# Patient Record
Sex: Female | Born: 1955 | Race: White | Hispanic: No | Marital: Married | Smoking: Current every day smoker
Health system: Southern US, Community
[De-identification: ages and names within clinical notes are randomized; demographics above are authoritative.]

## PROBLEM LIST (undated history)

## (undated) DIAGNOSIS — J449 Chronic obstructive pulmonary disease, unspecified: Secondary | ICD-10-CM

## (undated) DIAGNOSIS — I10 Essential (primary) hypertension: Secondary | ICD-10-CM

## (undated) DIAGNOSIS — J45909 Unspecified asthma, uncomplicated: Secondary | ICD-10-CM

## (undated) DIAGNOSIS — M1990 Unspecified osteoarthritis, unspecified site: Secondary | ICD-10-CM

---

## 2004-10-20 ENCOUNTER — Ambulatory Visit: Payer: Self-pay | Admitting: Internal Medicine

## 2006-07-06 ENCOUNTER — Ambulatory Visit: Payer: Self-pay | Admitting: Unknown Physician Specialty

## 2009-01-14 ENCOUNTER — Ambulatory Visit: Payer: Self-pay

## 2010-01-15 ENCOUNTER — Ambulatory Visit: Payer: Self-pay

## 2011-01-06 ENCOUNTER — Ambulatory Visit: Payer: Self-pay | Admitting: Family Medicine

## 2011-01-15 ENCOUNTER — Ambulatory Visit: Payer: Self-pay | Admitting: Family Medicine

## 2011-01-27 ENCOUNTER — Ambulatory Visit: Payer: Self-pay | Admitting: Family Medicine

## 2012-10-08 ENCOUNTER — Ambulatory Visit: Payer: Self-pay | Admitting: Internal Medicine

## 2013-01-12 ENCOUNTER — Ambulatory Visit: Payer: Self-pay | Admitting: Obstetrics and Gynecology

## 2014-03-20 ENCOUNTER — Ambulatory Visit: Payer: Self-pay | Admitting: Obstetrics and Gynecology

## 2014-03-28 ENCOUNTER — Ambulatory Visit: Payer: Self-pay | Admitting: Emergency Medicine

## 2014-05-24 ENCOUNTER — Ambulatory Visit: Payer: Self-pay | Admitting: Gastroenterology

## 2014-05-27 LAB — PATHOLOGY REPORT

## 2014-10-25 ENCOUNTER — Observation Stay: Payer: Self-pay | Admitting: Internal Medicine

## 2014-10-25 LAB — CBC
HCT: 48.2 % — AB (ref 35.0–47.0)
HGB: 16.1 g/dL — ABNORMAL HIGH (ref 12.0–16.0)
MCH: 31.9 pg (ref 26.0–34.0)
MCHC: 33.4 g/dL (ref 32.0–36.0)
MCV: 95 fL (ref 80–100)
PLATELETS: 300 10*3/uL (ref 150–440)
RBC: 5.05 10*6/uL (ref 3.80–5.20)
RDW: 12.4 % (ref 11.5–14.5)
WBC: 5.5 10*3/uL (ref 3.6–11.0)

## 2014-10-25 LAB — BASIC METABOLIC PANEL
Anion Gap: 8 (ref 7–16)
BUN: 12 mg/dL (ref 7–18)
CHLORIDE: 109 mmol/L — AB (ref 98–107)
Calcium, Total: 8.8 mg/dL (ref 8.5–10.1)
Co2: 27 mmol/L (ref 21–32)
Creatinine: 0.66 mg/dL (ref 0.60–1.30)
Glucose: 103 mg/dL — ABNORMAL HIGH (ref 65–99)
Osmolality: 287 (ref 275–301)
Potassium: 3.7 mmol/L (ref 3.5–5.1)
SODIUM: 144 mmol/L (ref 136–145)

## 2014-10-25 LAB — TROPONIN I

## 2014-10-26 LAB — BASIC METABOLIC PANEL
Anion Gap: 4 — ABNORMAL LOW (ref 7–16)
BUN: 12 mg/dL (ref 7–18)
CHLORIDE: 110 mmol/L — AB (ref 98–107)
CO2: 29 mmol/L (ref 21–32)
Calcium, Total: 8.2 mg/dL — ABNORMAL LOW (ref 8.5–10.1)
Creatinine: 0.68 mg/dL (ref 0.60–1.30)
EGFR (African American): >60
EGFR (Non-African Amer.): >60
Glucose: 100 mg/dL — ABNORMAL HIGH (ref 65–99)
OSMOLALITY: 285 (ref 275–301)
Potassium: 4 mmol/L (ref 3.5–5.1)
Sodium: 143 mmol/L (ref 136–145)

## 2014-10-26 LAB — CBC WITH DIFFERENTIAL/PLATELET
BASOS PCT: 1 %
Basophil #: 0.1 10*3/uL (ref 0.0–0.1)
EOS ABS: 0.3 10*3/uL (ref 0.0–0.7)
Eosinophil %: 4.1 %
HCT: 43.5 % (ref 35.0–47.0)
HGB: 14.8 g/dL (ref 12.0–16.0)
Lymphocyte #: 2.9 10*3/uL (ref 1.0–3.6)
Lymphocyte %: 45.8 %
MCH: 32 pg (ref 26.0–34.0)
MCHC: 34 g/dL (ref 32.0–36.0)
MCV: 94 fL (ref 80–100)
Monocyte #: 0.5 x10 3/mm (ref 0.2–0.9)
Monocyte %: 7.7 %
Neutrophil #: 2.6 10*3/uL (ref 1.4–6.5)
Neutrophil %: 41.4 %
Platelet: 265 10*3/uL (ref 150–440)
RBC: 4.62 10*6/uL (ref 3.80–5.20)
RDW: 12.6 % (ref 11.5–14.5)
WBC: 6.3 10*3/uL (ref 3.6–11.0)

## 2014-10-26 LAB — LIPID PANEL
Cholesterol: 176 mg/dL (ref 0–200)
HDL Cholesterol: 63 mg/dL — ABNORMAL HIGH (ref 40–60)
Ldl Cholesterol, Calc: 105 mg/dL — ABNORMAL HIGH (ref 0–100)
Triglycerides: 39 mg/dL (ref 0–200)
VLDL Cholesterol, Calc: 8 mg/dL (ref 5–40)

## 2014-10-26 LAB — HEMOGLOBIN A1C: HEMOGLOBIN A1C: 5.7 % (ref 4.2–6.3)

## 2014-11-01 DIAGNOSIS — Z720 Tobacco use: Secondary | ICD-10-CM | POA: Insufficient documentation

## 2014-11-01 DIAGNOSIS — Z8673 Personal history of transient ischemic attack (TIA), and cerebral infarction without residual deficits: Secondary | ICD-10-CM | POA: Insufficient documentation

## 2014-11-19 DIAGNOSIS — I10 Essential (primary) hypertension: Secondary | ICD-10-CM | POA: Insufficient documentation

## 2014-12-20 DIAGNOSIS — G459 Transient cerebral ischemic attack, unspecified: Secondary | ICD-10-CM

## 2014-12-20 HISTORY — DX: Transient cerebral ischemic attack, unspecified: G45.9

## 2015-02-26 ENCOUNTER — Emergency Department: Payer: Self-pay | Admitting: Emergency Medicine

## 2015-04-12 NOTE — H&P (Signed)
PATIENT NAME:  Courtney [name], Courtney [name] MR#:  [id number] DATE OF BIRTH:  [birth year]  DATE OF ADMISSION:  10/25/2014  PRIMARY CARE PHYSICIAN:  None.    EMERGENCY ROOM PHYSICIAN: Jene Every, MD   CHIEF COMPLAINT: Left arm numbness.   HISTORY OF PRESENT ILLNESS:  This a 59 year old female patient who felt left arm numbness while driving to work today. The patient suddenly fellt  numbness in the left arm started her around 8:00 a.m. lasted for about 20 minutes.  After that, the numbness resolved, did not have any numbness in the legs.  No weakness in the left hand or left leg. No numbness or weakness in the right extremities. Denies any slurred speech. No facial droop, no blurred vision. No loss of consciousness. No headache. Concerning the numbness, the patient came to the Emergency Room. The patient's symptoms resolved by the time she came to the ER. In the ER, the patient had a blood pressure check, which is showing 195/93.  She had a CAT scan of the head which showed left frontal hypodensity suspicious for acute ischemia.  The patient is being admitted to observation status for stroke evaluation.  She denies any other complaints. Recently, recovering from, a common cold and bronchitis. The patient finished a course of Zithromax. Right now, she is using Flonase for fluid in her left ear.   PAST MEDICAL HISTORY: Significant for COPD, denies any high blood pressure and does not have any family doctor and has no follow-ups.   ALLERGIES: No known allergies.   SOCIAL HISTORY: Smokes about a pack per day, smoking for 40 years, no alcohol. No drugs. Works at [employer].  FAMILY HISTORY: Colon cancer in father.   PAST SURGICAL HISTORY: Significant for history of C-section.   HOME MEDICATIONS:  Albuterol 2 to 4 puffs every 4 hours as needed for wheezing and Flonase 2 sprays once a day.  REVIEW OF SYSTEMS.  CONSTITUTIONAL: No fever. No fatigue.  EYES: No blurred vision.  ENT: No tinnitus, has some ear  pain and fluid in the left ear and is getting treatment for that. She says the Flonase helped her. No difficulty swallowing.  RESPIRATORY: No cough. No wheezing.  CARDIOVASCULAR: No chest pain. No orthopnea. No pedal edema. No palpitations. No syncope.  GASTROINTESTINAL: No nausea. No vomiting. No abdominal pain. No hematemesis.  GENITOURINARY: No dysuria or hematuria.  ENDOCRINE: No polyuria or nocturia.  HEMATOLOGIC: No anemia or easy bruising.  INTEGUMENTARY: No skin rash.  MUSCULOSKELETAL: Denies any joint pains. No history of gout, no limited activity. NEUROLOGIC: Felt numb in the left hand this morning, resolved now.  There was no dementia, no epilepsy. No headache. No history of transient ischemic attacks. No seizures.  PSYCHIATRIC: No anxiety or depression.   PHYSICAL EXAMINATION:  VITAL SIGNS: Temperature 98 degrees Fahrenheit, heart rate 85, blood pressure 195/90, sats 100% on room air.  GENERAL: Alert, awake, and oriented 59 year old female seen in the Emergency Room, not in distress, answering questions appropriately.  HEAD: Atraumatic, normocephalic.  EYES: Pupils equal, reacting to light. No conjunctival pallor. No scleral icterus.  Nose:no Turbinate Hypertrophy, Throat:no Oropharyngeal erythema  EARS: No drainage. No external lesions. No tympanic membrane congestion in both ears.   NECK: Did not have any carotid bruits. Normal range of motion. No lymphadenopathy.  RESPIRATORY: Clear to auscultation. No wheeze. No rales. Not using accessory muscles for respiration.  CARDIOVASCULAR: S1, S2 regular. No murmurs. PMI not displaced. Pulses equal at carotid artery femoral and (dorsalis pedis.Marland Kitchen  No peripheral edema. GASTROINTESTINAL: Abdomen is soft, nontender, nondistended. Bowel sounds present. No organomegaly. No hernias.  EXTREMITIES: No extremity edema. No cyanosis, no clubbing.  SKIN: No skin rashes.  Warm and dry, good skin turgor.  NEUROLOGIC: Cranial nerves II through XII  are intact. Power 5/5 in upper and lower extremities. Sensation is intact. Bilaterally upper and lower extremities, deep tendon reflexes 2+ bilateral upper and lower extremities.   PSYCHIATRIC: Mood and affect are within normal limits.   LABORATORY DATA: WBC 5.3, hemoglobin 16.1, hematocrit 48.2, platelets 300,000.   Electrolytes; Sodium 144, potassium 3.7, chloride 109, bicarbonate 27, BUN 12, creatinine 0.6, glucose 103. LFTs within normal limits.  Troponin less than 0.02. Head CAT scan shows hypodensity in the left frontal lobe.  EKG shows normal sinus rhythm at 67 beats per minute.   ASSESSMENT AND PLAN:  161.  A 59 year old female with left arm numbness which resolved, but CT head is questionable for acute stroke in the left frontal lobe.  Admit her to observation status as left arm numbness does not cover the left frontal infarct.  Get an MRA of the brain to evaluate for any embolic phenomenon, monitor on telemetry for any arrhythmias. Check echocardiogram and also ultrasound of carotids.  Start her on aspirin and statins.  The patient will get neuro-checks every 2 hours for 4 times and then afterward every 4 hours for 24 hours. The patient has a risk factor of tobacco abuse and now has elevated blood pressure.  2.  Hypertension with no previous diagnosis. She may have essential hypertension, started on small dose beta blockers and keep the blood pressure a little bit high to allow permissive hypertension; keep blood pressure systolic 160/90.  3.  Tobacco abuse. counselled about smoking cessation  for 3 to 10 minutes. The patient will get nicotine patch.  4.  Check fasting lipids.  TIME SPENT: 55 minutes.       ____________________________ Katha HammingSnehalatha Alajah Witman, MD sk:DT D: 10/25/2014 14:17:43 ET T: 10/25/2014 15:03:21 ET JOB#: [id number]  cc: Katha HammingSnehalatha Neeka Urista, MD, <Dictator> Katha HammingSNEHALATHA Jaymie Mckiddy MD ELECTRONICALLY SIGNED 11/18/2014 18:40

## 2015-04-12 NOTE — Discharge Summary (Signed)
PATIENT NAME:  Courtney Courtney [name], Courtney Courtney [name] MR#:  [id number] DATE OF BIRTH:  May 26, 1956  DATE OF ADMISSION:  10/25/2014 DATE OF DISCHARGE:  10/26/2014  PRIMARY CARE PHYSICIAN: None.   CHIEF COMPLAINT: Left arm numbness.   ADMITTING DIAGNOSES:  1.  Left arm numbness with Courtney [name] CT head questionable for an acute cerebrovascular accident  in left frontal lobe.  2.  Hypertension with no previous diagnosis.  3.  Tobacco abuse   DISCHARGE DIAGNOSES:  1.  Transient ischemic attack, left arm numbness is completely resolved.  2.  Elevated blood pressure is completely resolved, blood pressure being normal not providing any medications.  3.  Tobacco abuse, counseled the patient to quit smoking.  4.  Hyperlipidemia.  5.  Lifestyle modification with diet and exercise.   PROCEDURES: None.   CONSULTATIONS: None.   BRIEF HISTORY AND PHYSICAL AND HOSPITAL COURSE: The patient is Courtney [name] 59 year old female who came in with left-sided numbness while driving to work on November 6. Please review history and physical for details. Her initial blood pressure was high at 195/93 in the ED.  CAT scan of the head shows left frontal hyperdensity suspicious for acute ischemia.   HOSPITAL COURSE: The patient was admitted to the hospital. MRI of the brain was done without contrast which has revealed no acute intracranial abnormality. Left frontal subcortical lesion was compatible with remote ischemia. Carotid Dopplers were done, no significant atherosclerosis was noticed. Echocardiogram has revealed normal ejection fraction of 60%-65%. The patient's left upper extremity weakness is completely resolved. She denies any dysphagia or dysarthria, ambulatory difficulties, or headache. No blurry vision either. Fasting lipid panel was checked. The patient's LDL was at 105. Condition being stable, decision is to discharge the patient home. We have recommended the patient to continue baby aspirin and lifestyle modifications were advised for LDL being at  105. She verbalized understanding.  The patient is hesitant to start any statins at this time and she prefers following with her primary care as an outpatient.   The patient's blood pressure was monitored closely, on November 6 it was at 108/68 and November 7 was 130/72, 124/80. Blood pressure being stable I was really hesitant to start her on any blood pressure medications. The patient reported that sometimes she gets stressed at work which could be the cause of her transient elevation of blood pressure. We have recommended the patient to continue monitoring her blood pressure on Courtney [name] daily basis and maintain blood pressure log. She verbalized understanding. Nicotine cessation counseling was provided and the patient was recommended to quit smoking and continue over-the-counter nicotine patch.   CONDITION AT THE TIME OF DISCHARGE: Stable.   ACTIVITY: As tolerated.   FOLLOWUP: Follow up with her primary care in 1-2 weeks.   MEDICATIONS AT THE TIME OF DISCHARGE: Aspirin 81 mg p.o. once daily, Lipitor 10 mg p.o. at bedtime was written, but the patient is uncomfortable taking this medication, she wants to try lifestyle modifications at this time. Nicotine counseling was provided. The patient is to continue nicotine 7 mg patch transdermally once daily, Flonase 2 sprays nasally once daily, albuterol 2-4 puffs inhalation every 4 hours as needed for shortness of breath.   DIET: Low-fat, low-sodium.    SIGNIFICANT LABORATORIES AND IMAGING STUDIES:  Echocardiogram, left ventricular ejection fraction 60%-65%, left atrium is normal, no cardiac source of emboli was identified.  MRI of the  brain, no acute intracranial abnormalities. Carotid Dopplers, no significant stenosis. CBC is normal. Total cholesterol 136, LDL 105. Calcium 8.2. BUN  and creatinine were normal.  The rest of the laboratories are normal. Calcium 8.2.    Plan of care discussed in detail with the patient and her daughter at bedside, they both  verbalized understanding of the plan.   TOTAL TIME SPENT ON THE DISCHARGE: 45 minutes.    ____________________________ Ramonita Lab, MD ag:bu D: 10/30/2014 16:12:32 ET T: 10/30/2014 19:38:09 ET JOB#: [id number]  cc: Ramonita Lab, MD, <Dictator> Primary care at [facility] MD ELECTRONICALLY SIGNED 11/06/2014 14:13

## 2015-06-26 ENCOUNTER — Other Ambulatory Visit: Payer: Self-pay | Admitting: Family Medicine

## 2015-06-26 DIAGNOSIS — Z1231 Encounter for screening mammogram for malignant neoplasm of breast: Secondary | ICD-10-CM

## 2015-07-08 ENCOUNTER — Ambulatory Visit
Admission: RE | Admit: 2015-07-08 | Discharge: 2015-07-08 | Disposition: A | Discharge status: Home / Self Care | Payer: BLUE CROSS/BLUE SHIELD | Source: Ambulatory Visit | Attending: Family Medicine | Admitting: Family Medicine

## 2015-07-08 DIAGNOSIS — Z1231 Encounter for screening mammogram for malignant neoplasm of breast: Secondary | ICD-10-CM

## 2015-12-25 ENCOUNTER — Ambulatory Visit
Admission: EM | Admit: 2015-12-25 | Discharge: 2015-12-25 | Disposition: A | Discharge status: Home / Self Care | Payer: BLUE CROSS/BLUE SHIELD | Attending: Family Medicine | Admitting: Family Medicine

## 2015-12-25 DIAGNOSIS — J069 Acute upper respiratory infection, unspecified: Secondary | ICD-10-CM | POA: Diagnosis not present

## 2015-12-25 HISTORY — DX: Essential (primary) hypertension: I10

## 2015-12-25 NOTE — ED Provider Notes (Signed)
CSN: [id number]     Arrival date & time 12/25/15  1305 History   First MD Initiated Contact with Patient 12/25/15 1523     Chief Complaint  Patient presents with  . URI   (Consider location/radiation/quality/duration/timing/severity/associated sxs/prior Treatment) HPI  60 year old female who presents with nasal congestion is started yesterday. He states that she is sneezing and her sinuses feel full. The mucus that she is sneezing is clear. She is afebrile but has had chills but usually has chills so this is not a new finding. She states the cough is not a prominent feature. She was encouraged by her coworkers to be seen today.  Past Medical History  Diagnosis Date  . Hypertension    Past Surgical History  Procedure Laterality Date  . Cesarean section  1986   Family History  Problem Relation Age of Onset  . Diabetes Mother   . Cancer Father    Social History  Substance Use Topics  . Smoking status: Current Every Day Smoker -- 1.00 packs/day    Types: Cigarettes  . Smokeless tobacco: None  . Alcohol Use: Yes   OB History    No data available     Review of Systems  Constitutional: Positive for chills. Negative for fever, appetite change and fatigue.  HENT: Positive for congestion, postnasal drip, rhinorrhea, sinus pressure and sneezing.   Respiratory: Negative for cough, shortness of breath, wheezing and stridor.   All other systems reviewed and are negative.   Allergies  Review of patient's allergies indicates not on file.  Home Medications   Prior to Admission medications   Not on File   Meds Ordered and Administered this Visit  Medications - No data to display  BP 127/74 mmHg  Pulse 76  Temp(Src) 97.3 F (36.3 C) (Tympanic)  Resp 16  Ht 5\' 5"  (1.651 m)  Wt 116 lb (52.617 kg)  BMI 19.30 kg/m2  SpO2 100% No data found.   Physical Exam  Constitutional: She is oriented to person, place, and time. She appears well-developed and well-nourished. No distress.   HENT:  Head: Normocephalic and atraumatic.  Right Ear: External ear normal.  Left Ear: External ear normal.  Mouth/Throat: Oropharynx is clear and moist. No oropharyngeal exudate.  Turbinates are swollen and erythematous. She does have clear mucus draining.  Eyes: Conjunctivae are normal. Pupils are equal, round, and reactive to light. Right eye exhibits no discharge. Left eye exhibits no discharge.  Neck: Normal range of motion. Neck supple.  Pulmonary/Chest: Effort normal and breath sounds normal. No stridor. No respiratory distress. She has no wheezes.  Musculoskeletal: Normal range of motion. She exhibits no edema or tenderness.  Lymphadenopathy:    She has no cervical adenopathy.  Neurological: She is alert and oriented to person, place, and time.  Skin: Skin is warm and dry. No rash noted. She is not diaphoretic. No erythema.  Psychiatric: She has a normal mood and affect. Her behavior is normal. Judgment and thought content normal.  Nursing note and vitals reviewed.   ED Course  Procedures (including critical care time)  Labs Review Labs Reviewed - No data to display  Imaging Review No results found.   Visual Acuity Review  Right Eye Distance:   Left Eye Distance:   Bilateral Distance:    Right Eye Near:   Left Eye Near:    Bilateral Near:         MDM   1. Acute URI    I told patient this most  likely a viral problem and not amenable to antibiotic use. We will treat this symptomatically. I advised her to use Flonase daily she states that she has somewhat home. So recommended she consider using Afrin nasal spray for 2 days well the Flonase is establishing affect. 2 guidewires sinuses she continues Zyrtec-D or Allegra-D. Otherwise fluids and rest are indicated along with ibuprofen or Tylenol as necessary. I recommended she consider staying out of work tomorrow so that she continue to manage of the weekend to rest. She'll follow-up with her primary care if she has  any further problems   Lutricia Feil, PA-C 12/25/15 1546

## 2015-12-25 NOTE — Discharge Instructions (Signed)
Cool Mist Vaporizers °Vaporizers may help relieve the symptoms of a cough and cold. They add moisture to the air, which helps mucus to become thinner and less sticky. This makes it easier to breathe and cough up secretions. Cool mist vaporizers do not cause serious burns like hot mist vaporizers, which may also be called steamers or humidifiers. Vaporizers have not been proven to help with colds. You should not use a vaporizer if you are allergic to mold. °HOME CARE INSTRUCTIONS °· Follow the package instructions for the vaporizer. °· Do not use anything other than distilled water in the vaporizer. °· Do not run the vaporizer all of the time. This can cause mold or bacteria to grow in the vaporizer. °· Clean the vaporizer after each time it is used. °· Clean and dry the vaporizer well before storing it. °· Stop using the vaporizer if worsening respiratory symptoms develop. °  °This information is not intended to replace advice given to you by your health care provider. Make sure you discuss any questions you have with your health care provider. °  °Document Released: 09/02/2004 Document Revised: 12/11/2013 Document Reviewed: 04/25/2013 °Elsevier Interactive Patient Education ©2016 [employer] ° °Upper Respiratory Infection, Adult °Most upper respiratory infections (URIs) are a viral infection of the air passages leading to the lungs. A URI affects the nose, throat, and upper air passages. The most common type of URI is nasopharyngitis and is typically referred to as "the common cold." °URIs run their course and usually go away on their own. Most of the time, a URI does not require medical attention, but sometimes a bacterial infection in the upper airways can follow a viral infection. This is called a secondary infection. Sinus and middle ear infections are common types of secondary upper respiratory infections. °Bacterial pneumonia can also complicate a URI. A URI can worsen asthma and chronic obstructive  pulmonary disease (COPD). Sometimes, these complications can require emergency medical care and may be life threatening.  °CAUSES °Almost all URIs are caused by viruses. A virus is a type of germ and can spread from one person to another.  °RISKS FACTORS °You may be at risk for a URI if:  °· You smoke.   °· You have chronic heart or lung disease. °· You have a weakened defense (immune) system.   °· You are very young or very old.   °· You have nasal allergies or asthma. °· You work in crowded or poorly ventilated areas. °· You work in health care facilities or schools. °SIGNS AND SYMPTOMS  °Symptoms typically develop 2-3 days after you come in contact with a cold virus. Most viral URIs last 7-10 days. However, viral URIs from the influenza virus (flu virus) can last 14-18 days and are typically more severe. Symptoms may include:  °· Runny or stuffy (congested) nose.   °· Sneezing.   °· Cough.   °· Sore throat.   °· Headache.   °· Fatigue.   °· Fever.   °· Loss of appetite.   °· Pain in your forehead, behind your eyes, and over your cheekbones (sinus pain). °· Muscle aches.   °DIAGNOSIS  °Your health care provider may diagnose a URI by: °· Physical exam. °· Tests to check that your symptoms are not due to another condition such as: °¨ Strep throat. °¨ Sinusitis. °¨ Pneumonia. °¨ Asthma. °TREATMENT  °A URI goes away on its own with time. It cannot be cured with medicines, but medicines may be prescribed or recommended to relieve symptoms. Medicines may help: °· Reduce your fever. °· Reduce   your cough. °· Relieve nasal congestion. °HOME CARE INSTRUCTIONS  °· Take medicines only as directed by your health care provider.   °· Gargle warm saltwater or take cough drops to comfort your throat as directed by your health care provider. °· Use a warm mist humidifier or inhale steam from a shower to increase air moisture. This may make it easier to breathe. °· Drink enough fluid to keep your urine clear or pale yellow.   °· Eat  soups and other clear broths and maintain good nutrition.   °· Rest as needed.   °· Return to work when your temperature has returned to normal or as your health care provider advises. You may need to stay home longer to avoid infecting others. You can also use a face mask and careful hand washing to prevent spread of the virus. °· Increase the usage of your inhaler if you have asthma.   °· Do not use any tobacco products, including cigarettes, chewing tobacco, or electronic cigarettes. If you need help quitting, ask your health care provider. °PREVENTION  °The best way to protect yourself from getting a cold is to practice good hygiene.  °· Avoid oral or hand contact with people with cold symptoms.   °· Wash your hands often if contact occurs.   °There is no clear evidence that vitamin C, vitamin E, echinacea, or exercise reduces the chance of developing a cold. However, it is always recommended to get plenty of rest, exercise, and practice good nutrition.  °SEEK MEDICAL CARE IF:  °· You are getting worse rather than better.   °· Your symptoms are not controlled by medicine.   °· You have chills. °· You have worsening shortness of breath. °· You have brown or red mucus. °· You have yellow or brown nasal discharge. °· You have pain in your face, especially when you bend forward. °· You have a fever. °· You have swollen neck glands. °· You have pain while swallowing. °· You have white areas in the back of your throat. °SEEK IMMEDIATE MEDICAL CARE IF:  °· You have severe or persistent: °¨ Headache. °¨ Ear pain. °¨ Sinus pain. °¨ Chest pain. °· You have chronic lung disease and any of the following: °¨ Wheezing. °¨ Prolonged cough. °¨ Coughing up blood. °¨ A change in your usual mucus. °· You have a stiff neck. °· You have changes in your: °¨ Vision. °¨ Hearing. °¨ Thinking. °¨ Mood. °MAKE SURE YOU:  °· Understand these instructions. °· Will watch your condition. °· Will get help right away if you are not doing well or  get worse. °  °This information is not intended to replace advice given to you by your health care provider. Make sure you discuss any questions you have with your health care provider. °  °Document Released: 06/01/2001 Document Revised: 04/22/2015 Document Reviewed: 03/13/2014 °Elsevier Interactive Patient Education ©2016 [employer] ° °

## 2015-12-25 NOTE — ED Notes (Signed)
Started yesterday with nasal congestion. Now sneezing and "sinus's are full"

## 2015-12-31 DIAGNOSIS — R7303 Prediabetes: Secondary | ICD-10-CM | POA: Insufficient documentation

## 2016-04-20 ENCOUNTER — Encounter: Payer: Self-pay | Admitting: Emergency Medicine

## 2016-04-20 ENCOUNTER — Ambulatory Visit
Admission: EM | Admit: 2016-04-20 | Discharge: 2016-04-20 | Disposition: A | Discharge status: Home / Self Care | Payer: BLUE CROSS/BLUE SHIELD | Attending: Emergency Medicine | Admitting: Emergency Medicine

## 2016-04-20 DIAGNOSIS — J309 Allergic rhinitis, unspecified: Secondary | ICD-10-CM

## 2016-04-20 DIAGNOSIS — J441 Chronic obstructive pulmonary disease with (acute) exacerbation: Secondary | ICD-10-CM | POA: Diagnosis not present

## 2016-04-20 HISTORY — DX: Chronic obstructive pulmonary disease, unspecified: J44.9

## 2016-04-20 MED ORDER — PREDNISONE 20 MG PO TABS: 40.0000 mg | ORAL_TABLET | Freq: Every day | ORAL | Status: DC

## 2016-04-20 MED ORDER — ALBUTEROL SULFATE HFA 108 (90 BASE) MCG/ACT IN AERS: 2.0000 | INHALATION_SPRAY | RESPIRATORY_TRACT | Status: DC | PRN

## 2016-04-20 MED ORDER — LORATADINE 10 MG PO TABS: 10.0000 mg | ORAL_TABLET | Freq: Every day | ORAL | Status: DC

## 2016-04-20 MED ORDER — HYDROCOD POLST-CPM POLST ER 10-8 MG/5ML PO SUER: 5.0000 mL | Freq: Every evening | ORAL | Status: DC | PRN

## 2016-04-20 NOTE — Discharge Instructions (Signed)
Take medication as prescribed. Rest. Drink plenty of fluids.   Follow up with your primary care physician this week as needed. Return to Urgent care for new or worsening concerns.    Allergies An allergy is an abnormal reaction to a substance by the body's defense system (immune system). Allergies can develop at any age. WHAT CAUSES ALLERGIES? An allergic reaction happens when the immune system mistakenly reacts to a normally harmless substance, called an allergen, as if it were harmful. The immune system releases antibodies to fight the substance. Antibodies eventually release a chemical called histamine into the bloodstream. The release of histamine is meant to protect the body from infection, but it also causes discomfort. An allergic reaction can be triggered by:  Eating an allergen.  Inhaling an allergen.  Touching an allergen. WHAT TYPES OF ALLERGIES ARE THERE? There are many types of allergies. Common types include:  Seasonal allergies. People with this type of allergy are usually allergic to substances that are only present during certain seasons, such as molds and pollens.  Food allergies.  Drug allergies.  Insect allergies.  Animal dander allergies. WHAT ARE SYMPTOMS OF ALLERGIES? Possible allergy symptoms include:  Swelling of the lips, face, tongue, mouth, or throat.  Sneezing, coughing, or wheezing.  Nasal congestion.  Tingling in the mouth.  Rash.  Itching.  Itchy, red, swollen areas of skin (hives).  Watery eyes.  Vomiting.  Diarrhea.  Dizziness.  Lightheadedness.  Fainting.  Trouble breathing or swallowing.  Chest tightness.  Rapid heartbeat. HOW ARE ALLERGIES DIAGNOSED? Allergies are diagnosed with a medical and family history and one or more of the following:  Skin tests.  Blood tests.  A food diary. A food diary is a record of all the foods and drinks you have in a day and of all the symptoms you experience.  The results of an  elimination diet. An elimination diet involves eliminating foods from your diet and then adding them back in one by one to find out if a certain food causes an allergic reaction. HOW ARE ALLERGIES TREATED? There is no cure for allergies, but allergic reactions can be treated with medicine. Severe reactions usually need to be treated at a hospital. HOW CAN REACTIONS BE PREVENTED? The best way to prevent an allergic reaction is by avoiding the substance you are allergic to. Allergy shots and medicines can also help prevent reactions in some cases. People with severe allergic reactions may be able to prevent a life-threatening reaction called anaphylaxis with a medicine given right after exposure to the allergen.   This information is not intended to replace advice given to you by your health care provider. Make sure you discuss any questions you have with your health care provider.   Document Released: 03/01/2003 Document Revised: 12/27/2014 Document Reviewed: 09/17/2014 Elsevier Interactive Patient Education 2016 Elsevier Inc.  Chronic Obstructive Pulmonary Disease Exacerbation Chronic obstructive pulmonary disease (COPD) is a common lung condition in which airflow from the lungs is limited. COPD is a general term that can be used to describe many different lung problems that limit airflow, including chronic bronchitis and emphysema. COPD exacerbations are episodes when breathing symptoms become much worse and require extra treatment. Without treatment, COPD exacerbations can be life threatening, and frequent COPD exacerbations can cause further damage to your lungs. CAUSES  Respiratory infections.  Exposure to smoke.  Exposure to air pollution, chemical fumes, or dust. Sometimes there is no apparent cause or trigger. RISK FACTORS  Smoking cigarettes.  Older age.  Frequent prior COPD exacerbations. SIGNS AND SYMPTOMS  Increased coughing.  Increased thick spit (sputum)  production.  Increased wheezing.  Increased shortness of breath.  Rapid breathing.  Chest tightness. DIAGNOSIS Your medical history, a physical exam, and tests will help your health care provider make a diagnosis. Tests may include:  A chest X-ray.  Basic lab tests.  Sputum testing.  An arterial blood gas test. TREATMENT Depending on the severity of your COPD exacerbation, you may need to be admitted to a hospital for treatment. Some of the treatments commonly used to treat COPD exacerbations are:   Antibiotic medicines.  Bronchodilators. These are drugs that expand the air passages. They may be given with an inhaler or nebulizer. Spacer devices may be needed to help improve drug delivery.  Corticosteroid medicines.  Supplemental oxygen therapy.  Airway clearing techniques, such as noninvasive ventilation (NIV) and positive expiratory pressure (PEP). These provide respiratory support through a mask or other noninvasive device. HOME CARE INSTRUCTIONS  Do not smoke. Quitting smoking is very important to prevent COPD from getting worse and exacerbations from happening as often.  Avoid exposure to all substances that irritate the airway, especially to tobacco smoke.  If you were prescribed an antibiotic medicine, finish it all even if you start to feel better.  Take all medicines as directed by your health care provider.It is important to use correct technique with inhaled medicines.  Drink enough fluids to keep your urine clear or pale yellow (unless you have a medical condition that requires fluid restriction).  Use a cool mist vaporizer. This makes it easier to clear your chest when you cough.  If you have a home nebulizer and oxygen, continue to use them as directed.  Maintain all necessary vaccinations to prevent infections.  Exercise regularly.  Eat a healthy diet.  Keep all follow-up appointments as directed by your health care provider. SEEK IMMEDIATE  MEDICAL CARE IF:  You have worsening shortness of breath.  You have trouble talking.  You have severe chest pain.  You have blood in your sputum.  You have a fever.  You have weakness, vomit repeatedly, or faint.  You feel confused.  You continue to get worse. MAKE SURE YOU:  Understand these instructions.  Will watch your condition.  Will get help right away if you are not doing well or get worse.   This information is not intended to replace advice given to you by your health care provider. Make sure you discuss any questions you have with your health care provider.   Document Released: 10/03/2007 Document Revised: 12/27/2014 Document Reviewed: 08/10/2013 Elsevier Interactive Patient Education [employer]2016 Elsevier Inc.

## 2016-04-20 NOTE — ED Provider Notes (Signed)
[facility]  ____________________________________________  Time seen: Approximately 8:30 AM  I have reviewed the triage vital signs and the nursing notes.   HISTORY  Chief Complaint Cough  HPI Katheen A [name] is a 60 y.o. female presents with a complaint of 3 days of cough and chest congestion. Patient reports at time of symptom onset she was having some runny nose, watery and itchy eyes with some sneezing. Patient reports that his symptoms resolved yesterday. Patient states that she does continue with an intermittent cough. Patient states that she doesn't normally have seasonal allergies but states that her symptoms started while she was outside working planting flowers. Patient reports that yesterday and today she does occasionally hear herself wheeze when she is coughing. States her cough is primarily at night. Patient reports that she does have a history of COPD. Patient reports that she did have a home albuterol inhaler but states it is expired and requests a new prescription. Patient states that her wheezing is very mild. Denies others at home sick with similar. Reports continues to eat and drink well.  Denies recent sickness. Denies recent antibiotic use. Denies chest pain, shortness of breath, chest pain with deep breath, dizziness, weakness, abdominal pain, neck pain, back pain or dysuria.  PCP: Cliffton Asters   Past Medical History  Diagnosis Date  . Hypertension   . COPD (chronic obstructive pulmonary disease) (HCC)     There are no active problems to display for this patient.   Past Surgical History  Procedure Laterality Date  . Cesarean section  1986    Current Outpatient Rx  Name  Route  Sig  Dispense  Refill  . aspirin 81 MG tablet   Oral   Take 81 mg by mouth daily.         . hydrochlorothiazide (HYDRODIURIL) 25 MG tablet   Oral   Take 25 mg by mouth daily.           Allergies Review of patient's allergies indicates no known allergies.  Family  History  Problem Relation Age of Onset  . Diabetes Mother   . Cancer Father     Social History Social History  Substance Use Topics  . Smoking status: Current Every Day Smoker -- 1.00 packs/day    Types: Cigarettes  . Smokeless tobacco: None  . Alcohol Use: Yes    Review of Systems Constitutional: No fever/chills Eyes: No visual changes. ENT: No sore throat.As above. Cardiovascular: Denies chest pain. Respiratory: Denies shortness of breath. As above. Gastrointestinal: No abdominal pain.  No nausea, no vomiting.  No diarrhea.  No constipation. Genitourinary: Negative for dysuria. Musculoskeletal: Negative for back pain. Skin: Negative for rash. Neurological: Negative for headaches, focal weakness or numbness.  10-point ROS otherwise negative.  ____________________________________________   PHYSICAL EXAM:  VITAL SIGNS: ED Triage Vitals  Enc Vitals Group     BP 04/20/16 0814 131/63 mmHg     Pulse Rate 04/20/16 0814 84     Resp 04/20/16 0814 16     Temp 04/20/16 0814 97.4 F (36.3 C)     Temp Source 04/20/16 0814 Tympanic     SpO2 04/20/16 0814 98 %     Weight 04/20/16 0814 116 lb (52.617 kg)     Height 04/20/16 0814  (1.651 m)     Head Cir --      Peak Flow --      Pain Score 04/20/16 0816 4     Pain Loc --  Pain Edu? --      Excl. in GC? --   Constitutional: Alert and oriented. Well appearing and in no acute distress.Ambulatory in room a steady gait. Eyes: Conjunctivae are normal. PERRL. EOMI. Head: Atraumatic. No sinus tenderness to palpation. No swelling. No erythema.  Ears: no erythema, normal TMs bilaterally.   Nose:Nasal congestion with clear rhinorrhea  Mouth/Throat: Mucous membranes are moist. No pharyngeal erythema. No tonsillar swelling or exudate.  Neck: No stridor.  No cervical spine tenderness to palpation. Hematological/Lymphatic/Immunilogical: No cervical lymphadenopathy. Cardiovascular: Normal rate, regular rhythm. Grossly normal  heart sounds.  Good peripheral circulation. Respiratory: Normal respiratory effort.  No retractions. Mild scattered inspiratory and expiratory wheezes. No rales or rhonchi. Speaking in complete sentences. Occasional dry cough noted in room. Good air movement. No focal area of consolidation auscultated.  Gastrointestinal: Soft and nontender. Normal Bowel sounds. No CVA tenderness. Musculoskeletal: No lower or upper extremity tenderness nor edema. No cervical, thoracic or lumbar tenderness to palpation.No calf tenderness bilaterally.  Neurologic:  Normal speech and language. No gross focal neurologic deficits are appreciated. No gait instability. Skin:  Skin is warm, dry and intact. No rash noted. Psychiatric: Mood and affect are normal. Speech and behavior are normal.  ____________________________________________    LABS (all labs ordered are listed, but only abnormal results are displayed)  Labs Reviewed - No data to display  INITIAL IMPRESSION / ASSESSMENT AND PLAN / ED COURSE  Pertinent labs & imaging results that were available during my care of the patient were reviewed by me and considered in my medical decision making (see chart for details).  Well-appearing patient. No acute distress. Presents for the complaints of 3 days of cough and chest congestion. She reports initially began also with runny nose, sneezing and watery eyes after having been outside planting flowers. History of COPD. Mild scattered inspiratory and expiratory wheezes.  Suspect allergic rhinitis with mild COPD exacerbation. Counseled patient will treat supportively and symptomatically. Discussed no indication for antibiotic use at this time. Will treat with 5 day course of oral prednisone, albuterol inhaler when necessary, Claritin as well as when necessary Tussionex at night. Encourage rest, fluids and PCP follow-up.  Discussed follow up with Primary care physician this week. Discussed follow up and return parameters  including no resolution or any worsening concerns. Patient verbalized understanding and agreed to plan.   ____________________________________________   FINAL CLINICAL IMPRESSION(S) / ED DIAGNOSES  Final diagnoses:  COPD exacerbation (HCC)  Allergic rhinitis, unspecified allergic rhinitis type      Note: This dictation was prepared with Dragon dictation along with smaller phrase technology. Any transcriptional errors that result from this process are unintentional.    Renford DillsLindsey Raymond Azure, NP 04/20/16 [phone]

## 2016-04-20 NOTE — ED Notes (Signed)
Patient c/o cough and chest congestion since Saturday.  Patient denies fevers.

## 2016-07-05 ENCOUNTER — Other Ambulatory Visit: Payer: Self-pay | Admitting: Family Medicine

## 2016-07-05 DIAGNOSIS — Z1231 Encounter for screening mammogram for malignant neoplasm of breast: Secondary | ICD-10-CM

## 2016-07-12 ENCOUNTER — Other Ambulatory Visit: Payer: Self-pay | Admitting: Family Medicine

## 2016-07-12 ENCOUNTER — Ambulatory Visit
Admission: RE | Admit: 2016-07-12 | Discharge: 2016-07-12 | Disposition: A | Discharge status: Home / Self Care | Payer: BLUE CROSS/BLUE SHIELD | Source: Ambulatory Visit | Attending: Family Medicine | Admitting: Family Medicine

## 2016-07-12 DIAGNOSIS — Z1231 Encounter for screening mammogram for malignant neoplasm of breast: Secondary | ICD-10-CM

## 2017-05-26 DIAGNOSIS — F40243 Fear of flying: Secondary | ICD-10-CM | POA: Insufficient documentation

## 2017-05-26 DIAGNOSIS — F4321 Adjustment disorder with depressed mood: Secondary | ICD-10-CM | POA: Insufficient documentation

## 2017-07-06 ENCOUNTER — Other Ambulatory Visit: Payer: Self-pay | Admitting: Family Medicine

## 2017-07-06 DIAGNOSIS — Z1231 Encounter for screening mammogram for malignant neoplasm of breast: Secondary | ICD-10-CM

## 2017-07-18 ENCOUNTER — Ambulatory Visit
Admission: RE | Admit: 2017-07-18 | Discharge: 2017-07-18 | Disposition: A | Discharge status: Home / Self Care | Payer: BLUE CROSS/BLUE SHIELD | Source: Ambulatory Visit | Attending: Family Medicine | Admitting: Family Medicine

## 2017-07-18 DIAGNOSIS — Z1231 Encounter for screening mammogram for malignant neoplasm of breast: Secondary | ICD-10-CM

## 2018-03-16 ENCOUNTER — Ambulatory Visit
Admission: EM | Admit: 2018-03-16 | Discharge: 2018-03-16 | Disposition: A | Discharge status: Home / Self Care | Payer: BLUE CROSS/BLUE SHIELD | Attending: Family Medicine | Admitting: Family Medicine

## 2018-03-16 ENCOUNTER — Encounter: Payer: Self-pay | Admitting: *Deleted

## 2018-03-16 DIAGNOSIS — R51 Headache: Secondary | ICD-10-CM | POA: Diagnosis not present

## 2018-03-16 DIAGNOSIS — B349 Viral infection, unspecified: Secondary | ICD-10-CM | POA: Diagnosis not present

## 2018-03-16 DIAGNOSIS — R0981 Nasal congestion: Secondary | ICD-10-CM

## 2018-03-16 DIAGNOSIS — R6883 Chills (without fever): Secondary | ICD-10-CM | POA: Diagnosis not present

## 2018-03-16 MED ORDER — OLOPATADINE HCL 0.2 % OP SOLN: 1.0000 [drp] | Freq: Every day | OPHTHALMIC | 0 refills | Status: AC

## 2018-03-16 NOTE — ED Triage Notes (Signed)
Headache, burning eyes, nausea, chills, since yesterday.

## 2018-03-16 NOTE — ED Provider Notes (Signed)
[facility]    CSN: [id number] Arrival date & time: 03/16/18  1038  History   Chief Complaint Chief Complaint  Patient presents with  . Chills  . Nausea  . Headache  . Eye Drainage   HPI  62 year old female presents with the above complaints.  Symptoms started yesterday.  She reports congestion, chills, eyes burning, headache.  Also reports nausea.  No medications or interventions tried.  No fever.  No known exacerbating or relieving factors.  No reported sick contacts.  No other associated symptoms.  No other complaints at this time.  Past Medical History:  Diagnosis Date  . COPD (chronic obstructive pulmonary disease) (HCC)   . Hypertension    Past Surgical History:  Procedure Laterality Date  . CESAREAN SECTION  1986   OB History   None    Home Medications    Prior to Admission medications   Medication Sig Start Date End Date Taking? Authorizing Provider  aspirin 81 MG tablet Take 81 mg by mouth daily.   Yes [provider]  hydrochlorothiazide (HYDRODIURIL) 25 MG tablet Take 25 mg by mouth daily.   Yes [provider]  albuterol (PROVENTIL HFA;VENTOLIN HFA) 108 (90 Base) MCG/ACT inhaler Inhale 2 puffs into the lungs every 4 (four) hours as needed for wheezing or shortness of breath. 04/20/16   Renford DillsMiller, Lindsey, NP  loratadine (CLARITIN) 10 MG tablet Take 1 tablet (10 mg total) by mouth daily. 04/20/16 05/04/16  Renford DillsMiller, Lindsey, NP  Olopatadine HCl 0.2 % SOLN Apply 1 drop to eye daily. 03/16/18   Tommie Samsook, Ravonda Brecheen G, DO   Family History Family History  Problem Relation Age of Onset  . Diabetes Mother   . Cancer Father   . Breast cancer Neg Hx    Social History Social History   Tobacco Use  . Smoking status: Current Every Day Smoker    Packs/day: 1.00    Types: Cigarettes  . Smokeless tobacco: Never Used  Substance Use Topics  . Alcohol use: Yes  . Drug use: Never   Allergies   Patient has no known allergies.  Review of  Systems Review of Systems  Constitutional: Positive for chills.  HENT: Positive for congestion.   Eyes:       Eye burning.   Neurological: Positive for headaches.   Physical Exam Triage Vital Signs ED Triage Vitals  Enc Vitals Group     BP 03/16/18 1131 (!) 149/74     Pulse Rate 03/16/18 1131 73     Resp 03/16/18 1131 16     Temp 03/16/18 1131 98.1 F (36.7 C)     Temp Source 03/16/18 1131 Oral     SpO2 03/16/18 1131 98 %     Weight 03/16/18 1133 116 lb (52.6 kg)     Height 03/16/18 1133 5\' 5"  (1.651 m)     Head Circumference --      Peak Flow --      Pain Score 03/16/18 1133 0     Pain Loc --      Pain Edu? --      Excl. in GC? --    Updated Vital Signs BP (!) 149/74 (BP Location: Left Arm)   Pulse 73   Temp 98.1 F (36.7 C) (Oral)   Resp 16   Ht 5\' 5"  (1.651 m)   Wt 116 lb (52.6 kg)   SpO2 98%   BMI 19.30 kg/m   Physical Exam  Constitutional: She is oriented to person, place,  and time. She appears well-developed. No distress.  HENT:  Head: Normocephalic and atraumatic.  Mouth/Throat: Oropharynx is clear and moist.  Eyes: Conjunctivae are normal. Right eye exhibits no discharge. Left eye exhibits no discharge.  Cardiovascular: Normal rate and regular rhythm.  No murmur heard. Pulmonary/Chest: Effort normal and breath sounds normal. She has no wheezes. She has no rales.  Neurological: She is alert and oriented to person, place, and time.  Psychiatric: She has a normal mood and affect. Her behavior is normal.  Nursing note and vitals reviewed.  UC Treatments / Results  Labs (all labs ordered are listed, but only abnormal results are displayed) Labs Reviewed - No data to display  EKG None Radiology No results found.  Procedures Procedures (including critical care time)  Medications Ordered in UC Medications - No data to display   Initial Impression / Assessment and Plan / UC Course  I have reviewed the triage vital signs and the nursing  notes.  Pertinent labs & imaging results that were available during my care of the patient were reviewed by me and considered in my medical decision making (see chart for details).     62 year old female presents with a likely viral illness.  Her exam is unremarkable.  Pataday for eye burning/itching.  Final Clinical Impressions(s) / UC Diagnoses   Final diagnoses:  Viral illness    ED Discharge Orders        Ordered    Olopatadine HCl 0.2 % SOLN  Daily     03/16/18 1228     Controlled Substance Prescriptions [place] Controlled Substance Registry consulted? Not Applicable   Tommie Sams, DO 03/16/18 1402

## 2018-03-16 NOTE — Discharge Instructions (Signed)
Rest, fluids.  Eye drop as prescribed.  Take care  Dr. Adriana Simasook

## 2018-08-09 ENCOUNTER — Other Ambulatory Visit: Payer: Self-pay | Admitting: Family Medicine

## 2018-08-09 DIAGNOSIS — Z1231 Encounter for screening mammogram for malignant neoplasm of breast: Secondary | ICD-10-CM

## 2018-08-16 ENCOUNTER — Ambulatory Visit
Admission: RE | Admit: 2018-08-16 | Discharge: 2018-08-16 | Disposition: A | Discharge status: Home / Self Care | Payer: BLUE CROSS/BLUE SHIELD | Source: Ambulatory Visit | Attending: Family Medicine | Admitting: Family Medicine

## 2018-08-16 DIAGNOSIS — Z1231 Encounter for screening mammogram for malignant neoplasm of breast: Secondary | ICD-10-CM | POA: Insufficient documentation

## 2019-01-31 ENCOUNTER — Ambulatory Visit
Admission: EM | Admit: 2019-01-31 | Discharge: 2019-01-31 | Disposition: A | Discharge status: Home / Self Care | Payer: BLUE CROSS/BLUE SHIELD | Attending: Family Medicine | Admitting: Family Medicine

## 2019-01-31 ENCOUNTER — Encounter: Payer: Self-pay | Admitting: Emergency Medicine

## 2019-01-31 ENCOUNTER — Other Ambulatory Visit: Payer: Self-pay

## 2019-01-31 DIAGNOSIS — Z720 Tobacco use: Secondary | ICD-10-CM | POA: Diagnosis not present

## 2019-01-31 DIAGNOSIS — J441 Chronic obstructive pulmonary disease with (acute) exacerbation: Secondary | ICD-10-CM | POA: Diagnosis not present

## 2019-01-31 MED ORDER — HYDROCOD POLST-CPM POLST ER 10-8 MG/5ML PO SUER: 5.0000 mL | Freq: Every evening | ORAL | 0 refills | Status: DC | PRN

## 2019-01-31 MED ORDER — PREDNISONE 50 MG PO TABS: ORAL_TABLET | ORAL | 0 refills | Status: DC

## 2019-01-31 MED ORDER — ALBUTEROL SULFATE HFA 108 (90 BASE) MCG/ACT IN AERS: 1.0000 | INHALATION_SPRAY | Freq: Four times a day (QID) | RESPIRATORY_TRACT | 0 refills | Status: AC | PRN

## 2019-01-31 NOTE — Discharge Instructions (Signed)
Medications as prescribed. ° °Take care ° °Dr. Meda Dudzinski  °

## 2019-01-31 NOTE — ED Triage Notes (Signed)
Patient c/o cough x 10 days. She states it is worse at night time. Denies fever. Patient has been taking Flonase and Tessalon capsules.

## 2019-01-31 NOTE — ED Provider Notes (Signed)
[facility]    CSN: [id number] Arrival date & time: 01/31/19  1014  History   Chief Complaint Chief Complaint  Patient presents with  . Cough    APPT   HPI  63 year old female presents with cough.  Patient reports she has had a cough since last Sunday.  No fever.  Intermittently productive.  Clear sputum.  She is a smoker.  Has COPD.  Worse at night.  Waking her up early in the morning.  She has been taking Tessalon Perles without resolution.  No relieving factors.  No other associated symptoms.  No other complaints.  PMH, Surgical Hx, Family Hx, Social History reviewed and updated as below.  Past Medical History:  Diagnosis Date  . COPD (chronic obstructive pulmonary disease) (HCC)   . Hypertension    Past Surgical History:  Procedure Laterality Date  . CESAREAN SECTION  1986    OB History   No obstetric history on file.      Home Medications    Prior to Admission medications   Medication Sig Start Date End Date Taking? Authorizing Provider  aspirin 81 MG tablet Take 81 mg by mouth daily.   Yes [provider]  hydrochlorothiazide (HYDRODIURIL) 25 MG tablet Take 25 mg by mouth daily.   Yes [provider]  Olopatadine HCl 0.2 % SOLN Apply 1 drop to eye daily. 03/16/18  Yes Gearldean Lomanto G, DO  albuterol (PROVENTIL HFA;VENTOLIN HFA) 108 (90 Base) MCG/ACT inhaler Inhale 1-2 puffs into the lungs every 6 (six) hours as needed for wheezing or shortness of breath. 01/31/19   Tommie Sams, DO  chlorpheniramine-HYDROcodone (TUSSIONEX PENNKINETIC ER) 10-8 MG/5ML SUER Take 5 mLs by mouth at bedtime as needed. 01/31/19   Tommie Sams, DO  loratadine (CLARITIN) 10 MG tablet Take 1 tablet (10 mg total) by mouth daily. 04/20/16 05/04/16  Renford Dills, NP  predniSONE (DELTASONE) 50 MG tablet 1 tablet daily x 5 days 01/31/19   Tommie Sams, DO    Family History Family History  Problem Relation Age of Onset  . Diabetes Mother   . Cancer Father   .  Breast cancer Neg Hx     Social History Social History   Tobacco Use  . Smoking status: Current Every Day Smoker    Packs/day: 1.00    Types: Cigarettes  . Smokeless tobacco: Never Used  Substance Use Topics  . Alcohol use: Yes  . Drug use: Never     Allergies   Patient has no known allergies.   Review of Systems Review of Systems  Constitutional: Negative.   Respiratory: Positive for cough.    Physical Exam Triage Vital Signs ED Triage Vitals  Enc Vitals Group     BP 01/31/19 1026 124/72     Pulse Rate 01/31/19 1026 87     Resp 01/31/19 1026 18     Temp 01/31/19 1026 98.1 F (36.7 C)     Temp Source 01/31/19 1026 Oral     SpO2 01/31/19 1026 97 %     Weight 01/31/19 1025 114 lb (51.7 kg)     Height 01/31/19 1025 5\' 5"  (1.651 m)     Head Circumference --      Peak Flow --      Pain Score 01/31/19 1025 0     Pain Loc --      Pain Edu? --      Excl. in GC? --    Updated Vital Signs BP 124/72 (  BP Location: Right Arm)   Pulse 87   Temp 98.1 F (36.7 C) (Oral)   Resp 18   Ht 5\' 5"  (1.651 m)   Wt 51.7 kg   SpO2 97%   BMI 18.97 kg/m   Visual Acuity Right Eye Distance:   Left Eye Distance:   Bilateral Distance:    Right Eye Near:   Left Eye Near:    Bilateral Near:     Physical Exam Vitals signs and nursing note reviewed.  Constitutional:      Appearance: Normal appearance.  HENT:     Head: Normocephalic and atraumatic.     Nose: Nose normal.     Mouth/Throat:     Pharynx: Oropharynx is clear. No posterior oropharyngeal erythema.  Eyes:     General:        Right eye: No discharge.        Left eye: No discharge.     Conjunctiva/sclera: Conjunctivae normal.  Cardiovascular:     Rate and Rhythm: Normal rate and regular rhythm.  Pulmonary:     Effort: Pulmonary effort is normal.     Breath sounds: Normal breath sounds.  Neurological:     Mental Status: She is alert.  Psychiatric:        Mood and Affect: Mood normal.        Behavior:  Behavior normal.    UC Treatments / Results  Labs (all labs ordered are listed, but only abnormal results are displayed) Labs Reviewed - No data to display  EKG None  Radiology No results found.  Procedures Procedures (including critical care time)  Medications Ordered in UC Medications - No data to display  Initial Impression / Assessment and Plan / UC Course  I have reviewed the triage vital signs and the nursing notes.  Pertinent labs & imaging results that were available during my care of the patient were reviewed by me and considered in my medical decision making (see chart for details).    63 year old female presents with a COPD exacerbation. Treating with prednisone, albuterol, tussionex.  Final Clinical Impressions(s) / UC Diagnoses   Final diagnoses:  COPD exacerbation [facility](HCC)     Discharge Instructions     Medications as prescribed.  Take care  Dr. Adriana Simasook    ED Prescriptions    Medication Sig Dispense Auth. Provider   predniSONE (DELTASONE) 50 MG tablet 1 tablet daily x 5 days 5 tablet Renwick Asman G, DO   albuterol (PROVENTIL HFA;VENTOLIN HFA) 108 (90 Base) MCG/ACT inhaler Inhale 1-2 puffs into the lungs every 6 (six) hours as needed for wheezing or shortness of breath. 1 Inhaler [place], [place]Jay[place] G, DO   chlorpheniramine-HYDROcodone (TUSSIONEX PENNKINETIC ER) 10-8 MG/5ML SUER Take 5 mLs by mouth at bedtime as needed. 60 mL Tommie Samsook, Murphy Duzan G, DO     Controlled Substance Prescriptions [place] Controlled Substance Registry consulted? Not Applicable   Tommie SamsCook, Katiya Fike G, DO 01/31/19 1119

## 2020-07-22 ENCOUNTER — Emergency Department: Payer: BC Managed Care – PPO

## 2020-07-22 ENCOUNTER — Other Ambulatory Visit: Payer: Self-pay

## 2020-07-22 ENCOUNTER — Encounter: Payer: Self-pay | Admitting: Emergency Medicine

## 2020-07-22 ENCOUNTER — Emergency Department
Admission: EM | Admit: 2020-07-22 | Discharge: 2020-07-22 | Disposition: A | Discharge status: Home / Self Care | Payer: BC Managed Care – PPO | Attending: Emergency Medicine | Admitting: Emergency Medicine

## 2020-07-22 DIAGNOSIS — R531 Weakness: Secondary | ICD-10-CM | POA: Diagnosis not present

## 2020-07-22 DIAGNOSIS — M79602 Pain in left arm: Secondary | ICD-10-CM | POA: Diagnosis not present

## 2020-07-22 DIAGNOSIS — Z5321 Procedure and treatment not carried out due to patient leaving prior to being seen by health care provider: Secondary | ICD-10-CM | POA: Insufficient documentation

## 2020-07-22 DIAGNOSIS — R0789 Other chest pain: Secondary | ICD-10-CM | POA: Insufficient documentation

## 2020-07-22 LAB — BASIC METABOLIC PANEL
Anion gap: 8 (ref 5–15)
BUN: 9 mg/dL (ref 8–23)
CO2: 31 mmol/L (ref 22–32)
Calcium: 9.7 mg/dL (ref 8.9–10.3)
Chloride: 101 mmol/L (ref 98–111)
Creatinine, Ser: 0.59 mg/dL (ref 0.44–1.00)
GFR calc Af Amer: >60 mL/min (ref >60)
GFR calc non Af Amer: >60 mL/min (ref >60)
Glucose, Bld: 118 mg/dL — ABNORMAL HIGH (ref 70–99)
Potassium: 3.8 mmol/L (ref 3.5–5.1)
Sodium: 140 mmol/L (ref 135–145)

## 2020-07-22 LAB — CBC
HCT: 43.4 % (ref 36.0–46.0)
Hemoglobin: 15.4 g/dL — ABNORMAL HIGH (ref 12.0–15.0)
MCH: 32.2 pg (ref 26.0–34.0)
MCHC: 35.5 g/dL (ref 30.0–36.0)
MCV: 90.6 fL (ref 80.0–100.0)
Platelets: 282 10*3/uL (ref 150–400)
RBC: 4.79 MIL/uL (ref 3.87–5.11)
RDW: 12.2 % (ref 11.5–15.5)
WBC: 5.9 10*3/uL (ref 4.0–10.5)
nRBC: 0 % (ref 0.0–0.2)

## 2020-07-22 LAB — TROPONIN I (HIGH SENSITIVITY): Troponin I (High Sensitivity): 3 ng/L (ref <18)

## 2020-07-22 NOTE — ED Triage Notes (Signed)
Pt in via POV, reports left side chest pain with radiation into left arm.  Reports associated weakness.  Denies pain at this time.  NAD noted.

## 2020-07-28 ENCOUNTER — Other Ambulatory Visit: Payer: Self-pay | Admitting: Certified Nurse Midwife

## 2020-07-28 DIAGNOSIS — Z1231 Encounter for screening mammogram for malignant neoplasm of breast: Secondary | ICD-10-CM

## 2020-08-12 ENCOUNTER — Ambulatory Visit
Admission: RE | Admit: 2020-08-12 | Discharge: 2020-08-12 | Disposition: A | Discharge status: Home / Self Care | Payer: BC Managed Care – PPO | Source: Ambulatory Visit | Attending: Certified Nurse Midwife | Admitting: Certified Nurse Midwife

## 2020-08-12 ENCOUNTER — Other Ambulatory Visit: Payer: Self-pay

## 2020-08-12 DIAGNOSIS — Z1231 Encounter for screening mammogram for malignant neoplasm of breast: Secondary | ICD-10-CM | POA: Insufficient documentation

## 2020-08-12 DIAGNOSIS — I6523 Occlusion and stenosis of bilateral carotid arteries: Secondary | ICD-10-CM | POA: Insufficient documentation

## 2020-08-12 DIAGNOSIS — E782 Mixed hyperlipidemia: Secondary | ICD-10-CM | POA: Insufficient documentation

## 2021-04-03 IMAGING — MG DIGITAL SCREENING BILAT W/ TOMO W/ CAD
8 series · 9 of 24 positions shown · non-contrast
Comparison: Previous exam(s).

CLINICAL DATA: Screening.

EXAM:
DIGITAL SCREENING BILATERAL MAMMOGRAM WITH TOMO AND CAD

[L MLO synth-2D]
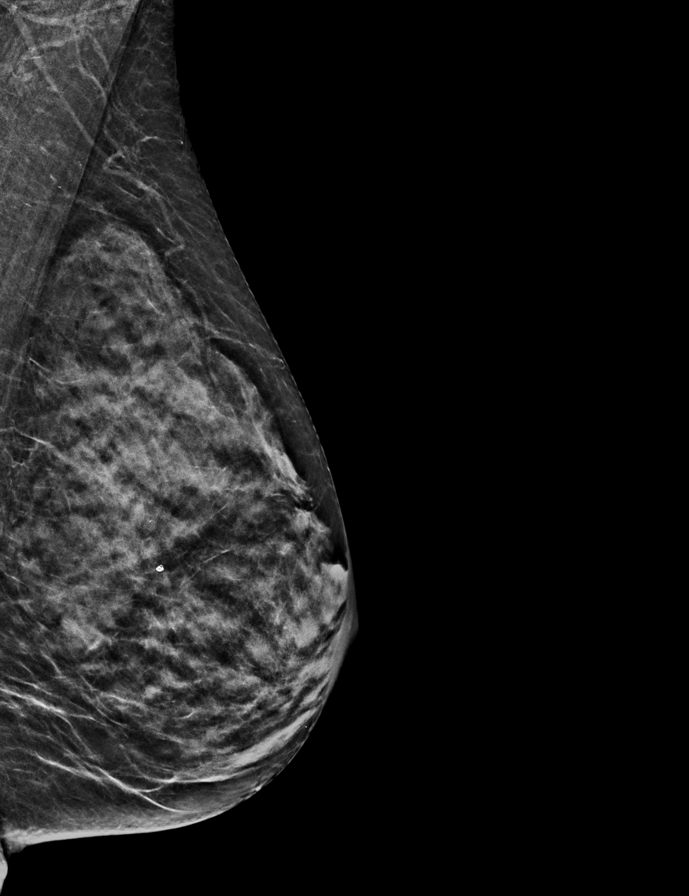

[R CC synth-2D]
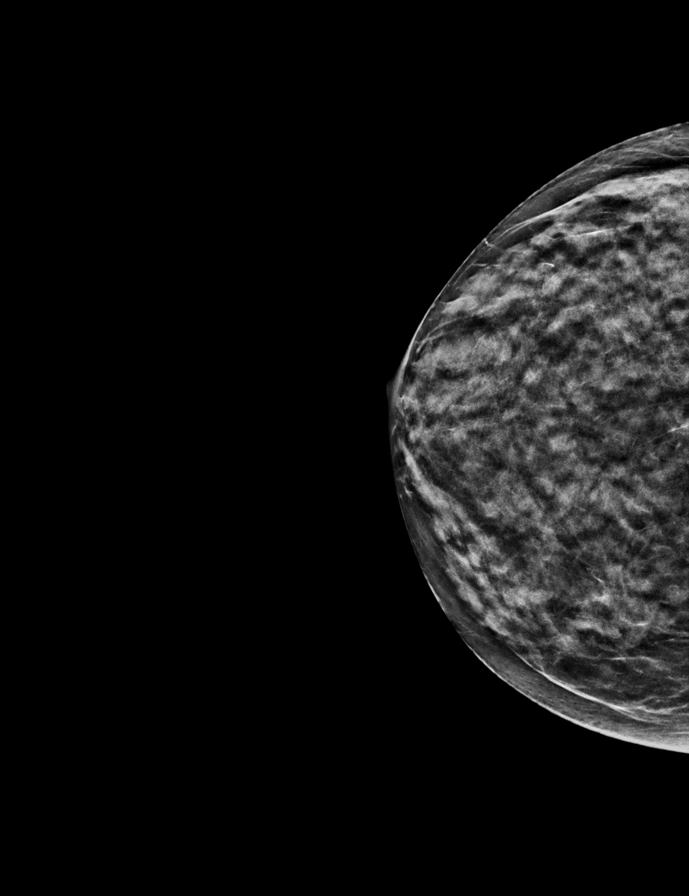

[R MLO synth-2D]
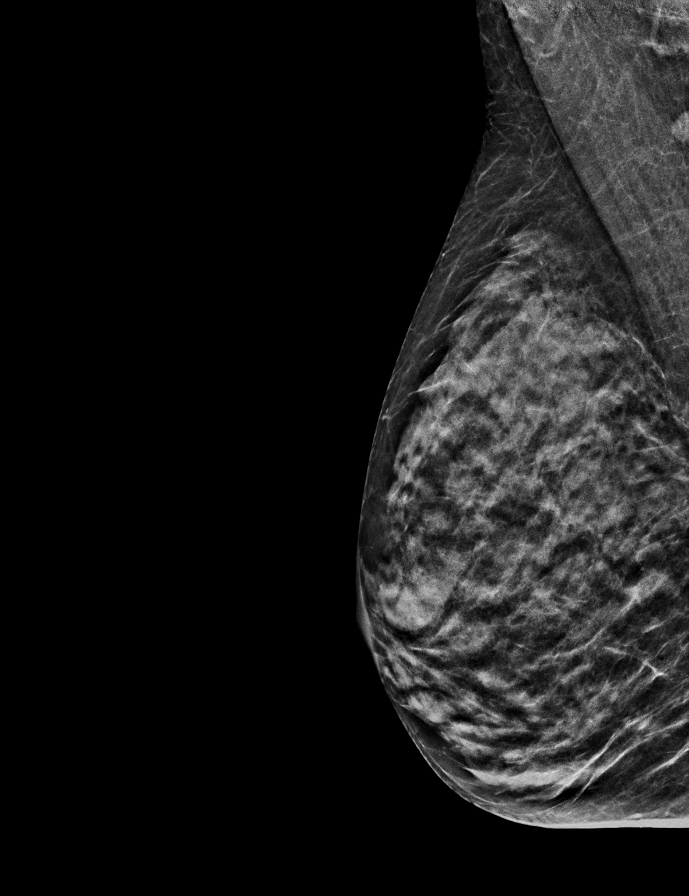

[L CC synth-2D]
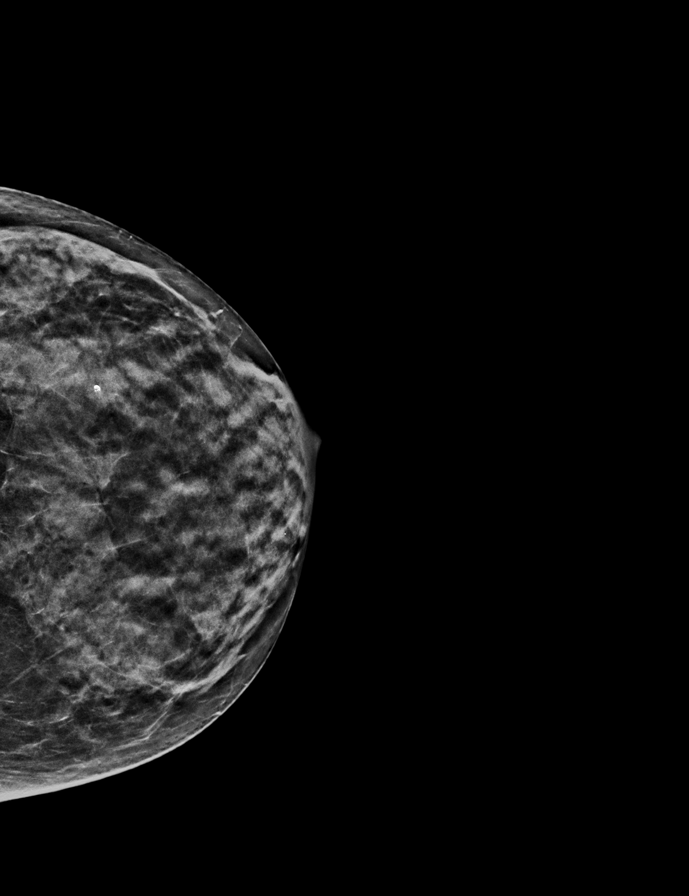

[R MLO tomo · 2 of 43 frames shown]
[frame 14/43]
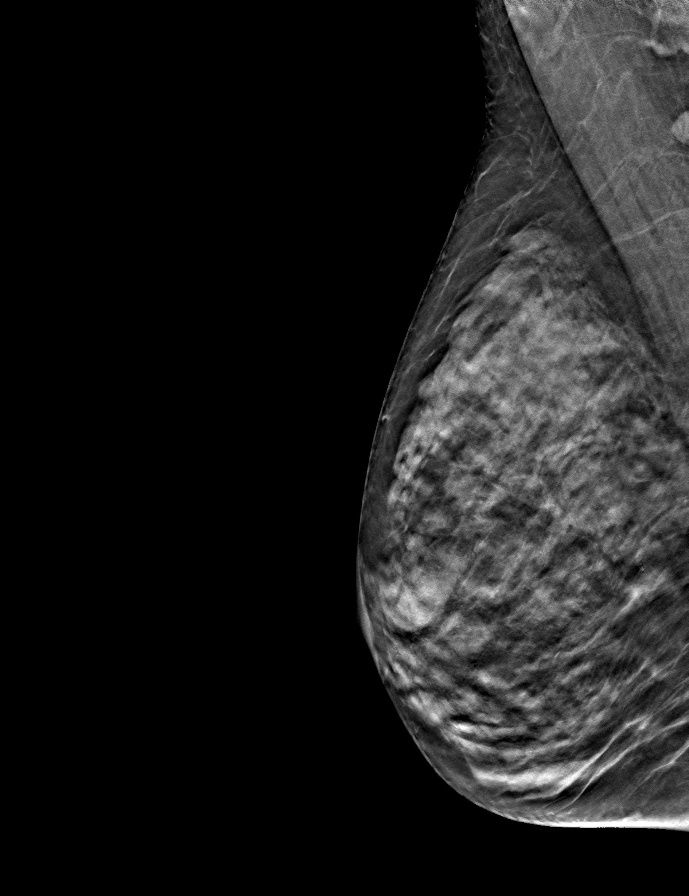
[frame 22/43]
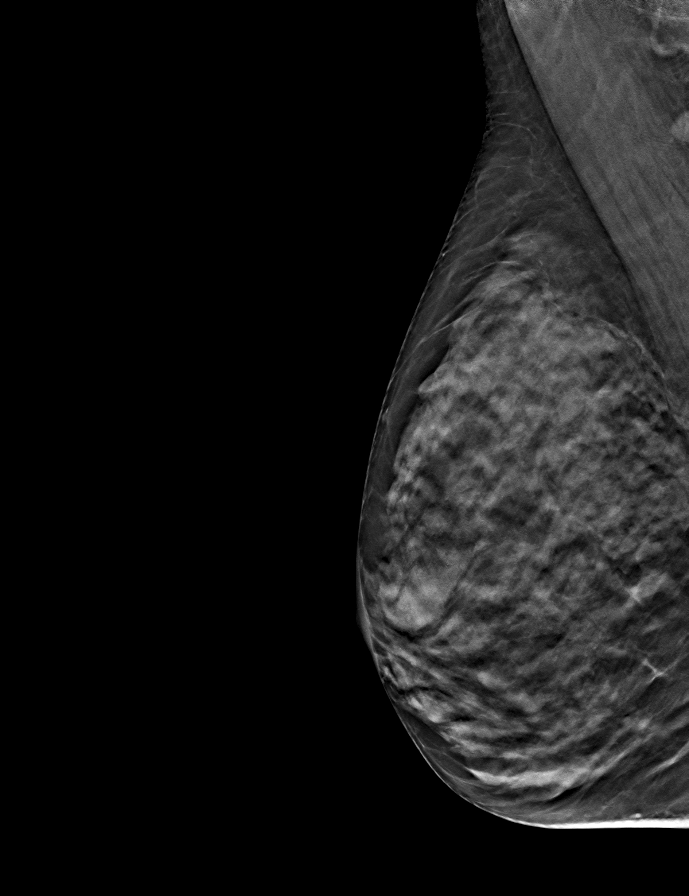

[L CC tomo · tomo slice 23/46.0]
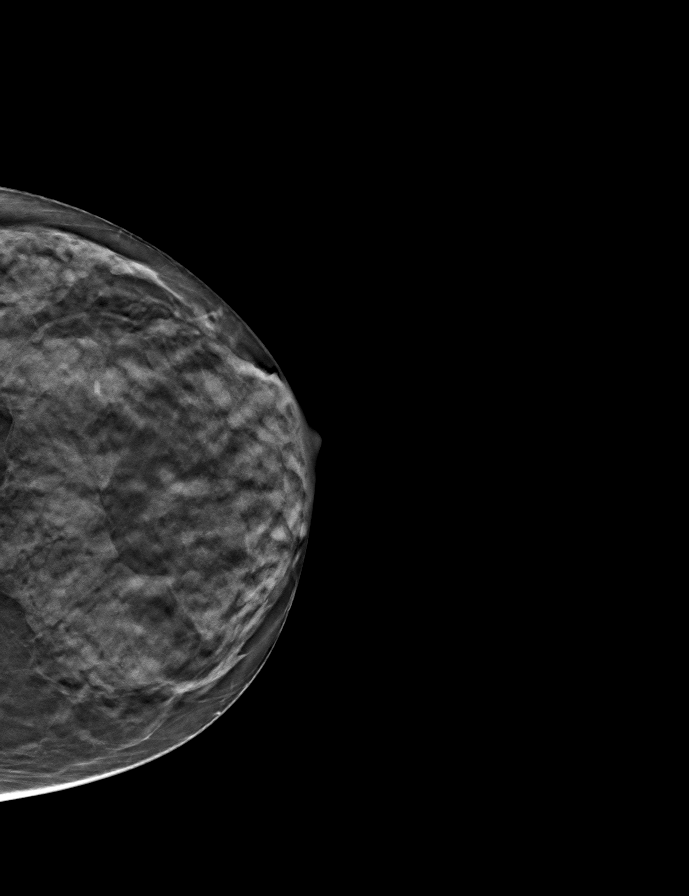

[L MLO tomo · tomo slice 22/43.0]
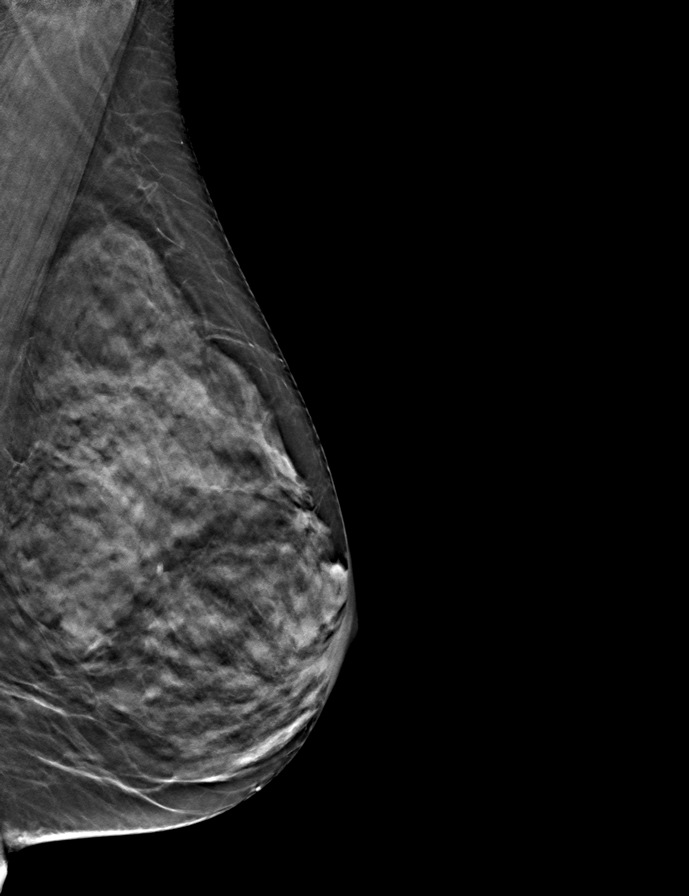

[R CC tomo · tomo slice 23/44.0]
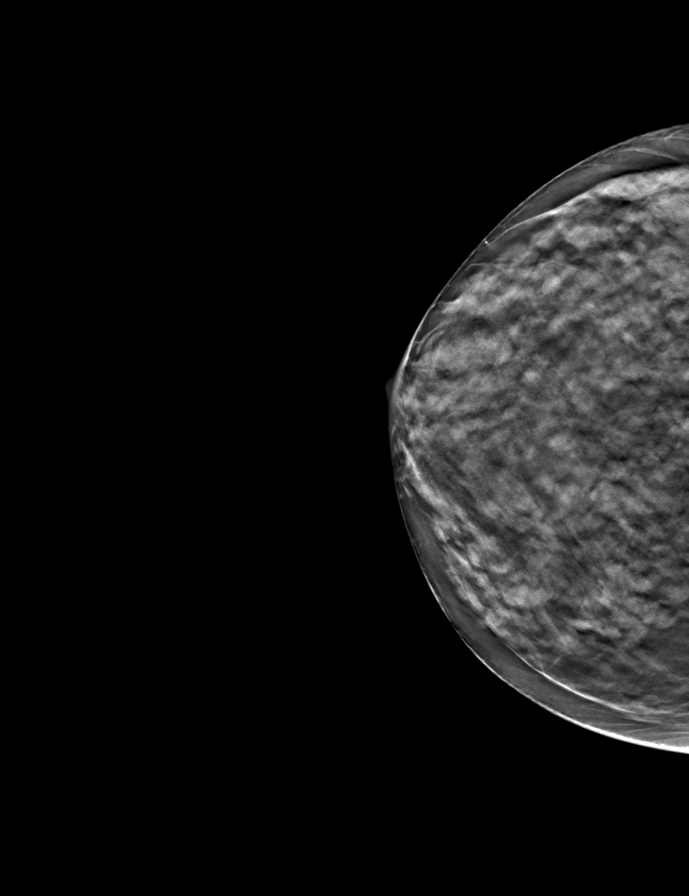

[9 of 24 positions shown; findings below may reference images not displayed]

ACR Breast Density Category d: The breast tissue is extremely dense,
which lowers the sensitivity of mammography
FINDINGS: There are no findings suspicious for malignancy. Images were
processed with CAD.
IMPRESSION: No mammographic evidence of malignancy. A result letter of this
screening mammogram will be mailed directly to the patient.

RECOMMENDATION:
Screening mammogram in one year. (Code:[id number])

BI-RADS CATEGORY  1: Negative.

## 2021-10-06 ENCOUNTER — Other Ambulatory Visit: Payer: Self-pay | Admitting: Family Medicine

## 2021-10-06 DIAGNOSIS — Z1231 Encounter for screening mammogram for malignant neoplasm of breast: Secondary | ICD-10-CM

## 2021-10-29 ENCOUNTER — Other Ambulatory Visit: Payer: Self-pay

## 2021-10-29 ENCOUNTER — Ambulatory Visit
Admission: RE | Admit: 2021-10-29 | Discharge: 2021-10-29 | Disposition: A | Discharge status: Home / Self Care | Payer: BC Managed Care – PPO | Source: Ambulatory Visit | Attending: Family Medicine | Admitting: Family Medicine

## 2021-10-29 DIAGNOSIS — Z1231 Encounter for screening mammogram for malignant neoplasm of breast: Secondary | ICD-10-CM | POA: Insufficient documentation

## 2021-12-04 DIAGNOSIS — J439 Emphysema, unspecified: Secondary | ICD-10-CM | POA: Insufficient documentation

## 2022-03-02 DIAGNOSIS — R0789 Other chest pain: Secondary | ICD-10-CM | POA: Insufficient documentation

## 2022-06-01 ENCOUNTER — Encounter: Payer: Self-pay | Admitting: Intensive Care

## 2022-06-01 ENCOUNTER — Emergency Department: Payer: BC Managed Care – PPO

## 2022-06-01 ENCOUNTER — Other Ambulatory Visit: Payer: Self-pay

## 2022-06-01 ENCOUNTER — Emergency Department
Admission: EM | Admit: 2022-06-01 | Discharge: 2022-06-01 | Disposition: A | Discharge status: Home / Self Care | Payer: BC Managed Care – PPO | Attending: Emergency Medicine | Admitting: Emergency Medicine

## 2022-06-01 DIAGNOSIS — Z720 Tobacco use: Secondary | ICD-10-CM

## 2022-06-01 DIAGNOSIS — R0602 Shortness of breath: Secondary | ICD-10-CM | POA: Diagnosis not present

## 2022-06-01 DIAGNOSIS — K529 Noninfective gastroenteritis and colitis, unspecified: Secondary | ICD-10-CM | POA: Diagnosis not present

## 2022-06-01 DIAGNOSIS — M545 Low back pain, unspecified: Secondary | ICD-10-CM | POA: Diagnosis present

## 2022-06-01 DIAGNOSIS — E278 Other specified disorders of adrenal gland: Secondary | ICD-10-CM | POA: Diagnosis not present

## 2022-06-01 DIAGNOSIS — R42 Dizziness and giddiness: Secondary | ICD-10-CM | POA: Diagnosis not present

## 2022-06-01 DIAGNOSIS — Z20822 Contact with and (suspected) exposure to covid-19: Secondary | ICD-10-CM | POA: Insufficient documentation

## 2022-06-01 DIAGNOSIS — R053 Chronic cough: Secondary | ICD-10-CM | POA: Diagnosis not present

## 2022-06-01 DIAGNOSIS — I10 Essential (primary) hypertension: Secondary | ICD-10-CM | POA: Diagnosis not present

## 2022-06-01 DIAGNOSIS — J45909 Unspecified asthma, uncomplicated: Secondary | ICD-10-CM | POA: Insufficient documentation

## 2022-06-01 DIAGNOSIS — I2510 Atherosclerotic heart disease of native coronary artery without angina pectoris: Secondary | ICD-10-CM | POA: Diagnosis not present

## 2022-06-01 DIAGNOSIS — F1010 Alcohol abuse, uncomplicated: Secondary | ICD-10-CM | POA: Insufficient documentation

## 2022-06-01 DIAGNOSIS — R197 Diarrhea, unspecified: Secondary | ICD-10-CM

## 2022-06-01 DIAGNOSIS — I700 Atherosclerosis of aorta: Secondary | ICD-10-CM

## 2022-06-01 HISTORY — DX: Unspecified asthma, uncomplicated: J45.909

## 2022-06-01 LAB — COMPREHENSIVE METABOLIC PANEL
ALT: 25 U/L (ref 0–44)
AST: 31 U/L (ref 15–41)
Albumin: 4.2 g/dL (ref 3.5–5.0)
Alkaline Phosphatase: 70 U/L (ref 38–126)
Anion gap: 7 (ref 5–15)
BUN: 11 mg/dL (ref 8–23)
CO2: 26 mmol/L (ref 22–32)
Calcium: 9.3 mg/dL (ref 8.9–10.3)
Chloride: 103 mmol/L (ref 98–111)
Creatinine, Ser: 0.46 mg/dL (ref 0.44–1.00)
GFR, Estimated: >60 mL/min (ref >60)
Glucose, Bld: 114 mg/dL — ABNORMAL HIGH (ref 70–99)
Potassium: 3.5 mmol/L (ref 3.5–5.1)
Sodium: 136 mmol/L (ref 135–145)
Total Bilirubin: 0.7 mg/dL (ref 0.3–1.2)
Total Protein: 6.9 g/dL (ref 6.5–8.1)

## 2022-06-01 LAB — SARS CORONAVIRUS 2 BY RT PCR: SARS Coronavirus 2 by RT PCR: NEGATIVE

## 2022-06-01 LAB — URINALYSIS, ROUTINE W REFLEX MICROSCOPIC
Bacteria, UA: NONE SEEN
Bilirubin Urine: NEGATIVE
Glucose, UA: NEGATIVE mg/dL
Ketones, ur: NEGATIVE mg/dL
Leukocytes,Ua: NEGATIVE
Nitrite: NEGATIVE
Protein, ur: NEGATIVE mg/dL
Specific Gravity, Urine: 1.004 — ABNORMAL LOW (ref 1.005–1.030)
Squamous Epithelial / HPF: NONE SEEN (ref 0–5)
pH: 6 (ref 5.0–8.0)

## 2022-06-01 LAB — CBC
HCT: 42.7 % (ref 36.0–46.0)
Hemoglobin: 14.7 g/dL (ref 12.0–15.0)
MCH: 32.5 pg (ref 26.0–34.0)
MCHC: 34.4 g/dL (ref 30.0–36.0)
MCV: 94.5 fL (ref 80.0–100.0)
Platelets: 274 10*3/uL (ref 150–400)
RBC: 4.52 MIL/uL (ref 3.87–5.11)
RDW: 11.9 % (ref 11.5–15.5)
WBC: 6.7 10*3/uL (ref 4.0–10.5)
nRBC: 0 % (ref 0.0–0.2)

## 2022-06-01 LAB — TROPONIN I (HIGH SENSITIVITY): Troponin I (High Sensitivity): 2 ng/L (ref <18)

## 2022-06-01 LAB — LIPASE, BLOOD: Lipase: 41 U/L (ref 11–51)

## 2022-06-01 MED ORDER — ACETAMINOPHEN 500 MG PO TABS
1000.0000 mg | ORAL_TABLET | Freq: Once | ORAL | Status: AC
  Administered 2022-06-01: 1000 mg via ORAL
  Filled 2022-06-01: qty 2

## 2022-06-01 MED ORDER — LIDOCAINE 5 % EX PTCH
1.0000 | MEDICATED_PATCH | CUTANEOUS | Status: DC
  Administered 2022-06-01: 1 via TRANSDERMAL
  Filled 2022-06-01: qty 1

## 2022-06-01 MED ORDER — LACTATED RINGERS IV BOLUS
1000.0000 mL | Freq: Once | INTRAVENOUS | Status: AC
  Administered 2022-06-01: 1000 mL via INTRAVENOUS

## 2022-06-01 MED ORDER — IOHEXOL 300 MG/ML  SOLN
100.0000 mL | Freq: Once | INTRAMUSCULAR | Status: AC | PRN
Start: 2022-06-01 — End: 2022-06-01
  Administered 2022-06-01: 100 mL via INTRAVENOUS

## 2022-06-01 NOTE — ED Provider Notes (Addendum)
[facility] Provider Note    Event Date/Time   First MD Initiated Contact with Patient 06/01/22 0730     (approximate)   History   Shortness of Breath   HPI  Courtney [name] is a 66 y.o. female with past medical history of HTN, HDL, asthma, GERD, migraine headaches, emphysema, OSA and tobacco abuse and EtOH abuse drinking about 4 beers per day who presents for evaluation of several symptoms including some bilateral lower back pain, nonbloody diarrhea as well as some dizziness in the setting of chronic cough and shortness of breath that typically wakes her up around 2:30 in the morning.  Patient states she woke up this morning around that time feeling a little dizzy with pain in her back and had an episode of significant diarrhea.  No urinary symptoms, abdominal pain, change in her chronic cough or shortness of breath, chest pain, headache and earache, sore throat or any focal extremity weakness numbness or tingling.  No recent falls or injuries or heavy lifting.  No recent antibiotics or travel outside [place].  No suspicious food intake yesterday.  She denies any illicit drug use.  No other acute concerns at this time.  No analgesia prior to arrival.      Physical Exam  Triage Vital Signs: ED Triage Vitals  Enc Vitals Group     BP 06/01/22 0710 (!) 167/69     Pulse Rate 06/01/22 0710 73     Resp 06/01/22 0710 20     Temp 06/01/22 0712 98.3 F (36.8 C)     Temp Source 06/01/22 0712 Oral     SpO2 06/01/22 0710 99 %     Weight 06/01/22 0711 110 lb (49.9 kg)     Height 06/01/22 0711 5\' 3"  (1.6 m)     Head Circumference --      Peak Flow --      Pain Score 06/01/22 0710 4     Pain Loc --      Pain Edu? --      Excl. in GC? --     Most recent vital signs: Vitals:   06/01/22 0712 06/01/22 0821  BP:  (!) 145/62  Pulse:  69  Resp:  20  Temp: 98.3 F (36.8 C)   SpO2:  99%    General: Awake, no distress.  CV:  Good peripheral perfusion.  2+  radial pulses.  No significant murmur. Resp:  Normal effort.  Clear bilaterally.  No increased effort. Abd:  No distention.  Soft throughout Other:  Cranial nerves II through XII grossly intact.  No pronator drift.  No finger dysmetria.  Symmetric 5/5 strength of all extremities.  Sensation intact to light touch in all extremities.  Unremarkable unassisted gait.  TMs are unremarkable bilaterally.  No rashes over the back or significant CVA tenderness.  No midline tenderness.   ED Results / Procedures / Treatments  Labs (all labs ordered are listed, but only abnormal results are displayed) Labs Reviewed  COMPREHENSIVE METABOLIC PANEL - Abnormal; Notable for the following components:      Result Value   Glucose, Bld 114 (*)    All other components within normal limits  URINALYSIS, ROUTINE W REFLEX MICROSCOPIC - Abnormal; Notable for the following components:   Color, Urine STRAW (*)    APPearance CLEAR (*)    Specific Gravity, Urine 1.004 (*)    Hgb urine dipstick SMALL (*)    All other components within normal limits  SARS CORONAVIRUS 2 BY RT PCR  LIPASE, BLOOD  CBC  TROPONIN I (HIGH SENSITIVITY)     EKG  ECG is remarkable for sinus rhythm with a ventricular rate of 74, normal axis, unremarkable intervals with nonspecific ST change in V2 as well as depressions in inferior lateral leads and nonspecific change in aVL.  Changes in V2 appears largely similar to ECG from 07/23/2020.   RADIOLOGY Chest reviewed by myself shows no focal consoidation, effusion, edema, pneumothorax or other clear acute thoracic process. I also reviewed radiology interpretation and agree with findings described.  CT abdomen pelvis on my interpretation shows some thickening of the sigmoid and descending colon without evidence of an abscess pneumatosis or perforation.  Do not see evidence of diverticulitis, appendicitis, kidney stone, perinephric stranding, AAA or other acute abdominal or pelvic process.  I  reviewed radiologist rotation and agree to findings of nonspecific colitis as well as an adrenal nodule with recommendation for follow-up imaging in 1 year.    PROCEDURES:  Critical Care performed: No  .1-3 Lead EKG Interpretation  Performed by: Gilles ChiquitoSmith, Caliyah Sieh P, MD Authorized by: Gilles ChiquitoSmith, Gualberto Wahlen P, MD     Interpretation: normal     ECG rate assessment: normal     Rhythm: sinus rhythm     Ectopy: none     Conduction: normal     The patient is on the cardiac monitor to evaluate for evidence of arrhythmia and/or significant heart rate changes.   MEDICATIONS ORDERED IN ED: Medications  lidocaine (LIDODERM) 5 % 1 patch (1 patch Transdermal Patch Applied 06/01/22 0823)  lactated ringers bolus 1,000 mL (1,000 mLs Intravenous New Bag/Given 06/01/22 0823)  acetaminophen (TYLENOL) tablet 1,000 mg (1,000 mg Oral Given 06/01/22 0825)  iohexol (OMNIPAQUE) 300 MG/ML solution 100 mL (100 mLs Intravenous Contrast Given 06/01/22 0832)     IMPRESSION / MDM / ASSESSMENT AND PLAN / ED COURSE  I reviewed the triage vital signs and the nursing notes. Patient's presentation is most consistent with acute presentation with potential threat to life or bodily function.                               Differential diagnosis includes, but is not limited to colitis, pancreatitis, cholecystitis, pyelonephritis, kidney stone, atypical ACS, arrhythmia, cystitis and metabolic derangements.  It seems her cough and shortness of breath are fairly chronic and she is denying any change in his symptoms today or any associated chest pain.  Chest x-ray is not suggestive of a pneumonia or CHF symptomatic oral effusion.  ECG is remarkable for sinus rhythm with a ventricular rate of 74, normal axis, unremarkable intervals with nonspecific ST change in V2 as well as depressions in inferior lateral leads and nonspecific change in aVL.  Changes in V2 appears largely similar to ECG from 07/23/2020.  CMP shows no significant  electrolyte or metabolic derangements.  There is no evidence of acute hepatitis or cholestatic process.  Lipase not suggestive of pancreatitis.  CBC without leukocytosis or acute anemia.  COVID PCR is negative.  Chest reviewed by myself shows no focal consoidation, effusion, edema, pneumothorax or other clear acute thoracic process. I also reviewed radiology interpretation and agree with findings described.  CT abdomen pelvis on my interpretation shows some thickening of the sigmoid and descending colon without evidence of an abscess pneumatosis or perforation.  Do not see evidence of diverticulitis, appendicitis, kidney stone, perinephric stranding, AAA or other acute abdominal  or pelvic process.  I reviewed radiologist rotation and agree to findings of nonspecific colitis as well as an adrenal nodule with recommendation for follow-up imaging in 1 year.  I suspect likely an acute infectious colitis.  Lower suspicion for ischemic colitis given absence of any significant volume loss or clear risk factors such as A-fib for embolic source.  On reassessment patient states she is feeling much better.  She states she wishes to be discharged.   Counseled on tobacco abuse and alcohol abuse.  Discussed returning for any new or worsening of symptoms.  Discharged in stable condition.  Strict return precautions advised and discussed.  Advised to discuss adrenal nodule imaging so PCP can arrange follow-up imaging as indicated.      FINAL CLINICAL IMPRESSION(S) / ED DIAGNOSES   Final diagnoses:  Acute bilateral low back pain without sciatica  Diarrhea, unspecified type  Dizzy  Chronic cough  Tobacco abuse  ETOH abuse  Aortic atherosclerosis (HCC)  Adrenal nodule (HCC)  Colitis     Rx / DC Orders   ED Discharge Orders     None        Note:  This document was prepared using Dragon voice recognition software and may include unintentional dictation errors.   Gilles Chiquito, MD 06/01/22  [id number]    Gilles Chiquito, MD 06/01/22 [phone]

## 2022-06-01 NOTE — Discharge Instructions (Addendum)
Your Ct today showed: IMPRESSION:  1. Although decompressed, the descending and sigmoid colon appear  somewhat thickened and hyperenhancing, suggestive of nonspecific  infectious, inflammatory, or ischemic colitis.  2. Left adrenal nodule measuring 2.9 x 2.0 cm. In the absence of a  known primary malignancy, this is statistically likely to represent  a benign adenoma. However given size, recommend follow-up adrenal  protocol CT or MRI in 1 year to establish stability and definitively  benign nature.   Aortic Atherosclerosis (ICD10-I70.0).

## 2022-06-01 NOTE — ED Triage Notes (Signed)
Pt c/o sob, dizziness, diarrhea, lower abdominal pain, and diarrhea X1 day

## 2022-06-01 NOTE — ED Notes (Signed)
Pt in bed, pt denies pain, pt states that she is ready to go home, pt verbalized understanding d/c and follow up, ambulatory from dpt.

## 2022-07-12 ENCOUNTER — Telehealth: Payer: Self-pay

## 2022-07-12 NOTE — Telephone Encounter (Signed)
Patients calll has been returned.  She has asked for a colonoscopy to be performed by Dr. Servando Snare at [facility].  Informed her that I will need colonoscopy referral sent from her pcp's office.  Once it has been received I will call her back to schedule.  Thanks,  [place], [place]

## 2022-07-26 ENCOUNTER — Other Ambulatory Visit: Payer: Self-pay

## 2022-07-27 ENCOUNTER — Encounter: Payer: Self-pay | Admitting: Gastroenterology

## 2022-07-27 ENCOUNTER — Ambulatory Visit: Payer: BC Managed Care – PPO | Admitting: Gastroenterology

## 2022-07-27 ENCOUNTER — Other Ambulatory Visit: Payer: Self-pay

## 2022-07-27 VITALS — BP 125/78 | HR 81 | Temp 98.0°F | Ht 63.0 in | Wt 107.1 lb

## 2022-07-27 DIAGNOSIS — Z1211 Encounter for screening for malignant neoplasm of colon: Secondary | ICD-10-CM

## 2022-07-27 DIAGNOSIS — Z86010 Personal history of colonic polyps: Secondary | ICD-10-CM

## 2022-07-27 DIAGNOSIS — R933 Abnormal findings on diagnostic imaging of other parts of digestive tract: Secondary | ICD-10-CM

## 2022-07-27 MED ORDER — NA SULFATE-K SULFATE-MG SULF 17.5-3.13-1.6 GM/177ML PO SOLN: 354.0000 mL | Freq: Once | ORAL | 0 refills | Status: AC

## 2022-07-27 NOTE — Progress Notes (Signed)
Courtney Repress, MD [address]  [address]  [place], [place] [address]  Main: [phone]  Fax: [phone]    Gastroenterology Consultation  Referring Provider:     Dione [name], * Primary Care Physician:  Courtney [name], CNM Primary Gastroenterologist:  Dr. Arlyss [name] Reason for Consultation: Colitis        HPI:   Courtney [name] is a 66 y.o. female referred by Dr. Genia [name], CNM  for consultation & management of colitis.  Patient went to ER on 06/01/2022 secondary to lower abdominal pain and 1 episode of diarrhea.  She underwent CT abdomen pelvis with contrast which revealed possible thickening and hyperenhancement of the descending and sigmoid colon.  Her labs including CBC, serum lipase and CMP were unrevealing.  Her symptoms resolved within a week.  Therefore, referred to GI for further evaluation.  Patient is no longer experiencing any GI symptoms.  She is due for her screening colonoscopy  NSAIDs: None  Antiplts/Anticoagulants/Anti thrombotics: None   Past Medical History:  Diagnosis Date   Asthma    COPD (chronic obstructive pulmonary disease) (HCC)    Hypertension     Past Surgical History:  Procedure Laterality Date   CESAREAN SECTION  1986     Current Outpatient Medications:    albuterol (PROVENTIL HFA;VENTOLIN HFA) 108 (90 Base) MCG/ACT inhaler, Inhale 1-2 puffs into the lungs every 6 (six) hours as needed for wheezing or shortness of breath., Disp: 1 Inhaler, Rfl: 0   aspirin 81 MG tablet, Take 81 mg by mouth daily., Disp: , Rfl:    hydrochlorothiazide (HYDRODIURIL) 25 MG tablet, Take 25 mg by mouth daily., Disp: , Rfl:    Multiple Vitamin (MULTI-VITAMIN) tablet, Take 1 tablet by mouth daily., Disp: , Rfl:    Olopatadine HCl 0.2 % SOLN, Apply 1 drop to eye daily., Disp: 2.5 mL, Rfl: 0   loratadine (CLARITIN) 10 MG tablet, Take 1 tablet (10 mg total) by mouth daily., Disp: 10 tablet, Rfl: 0   Family History  Problem  Relation Age of Onset   Diabetes Mother    Cancer Father    Breast cancer Neg Hx      Social History   Tobacco Use   Smoking status: Every Day    Packs/day: 1.00    Types: Cigarettes   Smokeless tobacco: Never  Vaping Use   Vaping Use: Never used  Substance Use Topics   Alcohol use: Yes    Alcohol/week: 6.0 standard drinks of alcohol    Types: 6 Cans of beer per week   Drug use: Never    Allergies as of 07/27/2022 - Review Complete 07/27/2022  Allergen Reaction Noted   Beclomethasone Cough 07/05/2016    Review of Systems:    All systems reviewed and negative except where noted in HPI.   Physical Exam:  BP 125/78 (BP Location: Left Arm, Patient Position: Sitting, Cuff Size: Normal)   Pulse 81   Temp 98 F (36.7 C) (Oral)   Ht 5\' 3"  (1.6 m)   Wt 107 lb 2 oz (48.6 kg)   BMI 18.98 kg/m  No LMP recorded. Patient is postmenopausal.  General:   Alert,  Well-developed, well-nourished, pleasant and cooperative in NAD Head:  Normocephalic and atraumatic. Eyes:  Sclera clear, no icterus.   Conjunctiva pink. Ears:  Normal auditory acuity. Nose:  No deformity, discharge, or lesions. Mouth:  No deformity or lesions,oropharynx pink & moist. Neck:  Supple; no masses or thyromegaly. Lungs:  Respirations even and unlabored.  Clear throughout to auscultation.   No wheezes, crackles, or rhonchi. No acute distress. Heart:  Regular rate and rhythm; no murmurs, clicks, rubs, or gallops. Abdomen:  Normal bowel sounds. Soft, non-tender and non-distended without masses, hepatosplenomegaly or hernias noted.  No guarding or rebound tenderness.   Rectal: Not performed Msk:  Symmetrical without gross deformities. Good, equal movement & strength bilaterally. Pulses:  Normal pulses noted. Extremities:  No clubbing or edema.  No cyanosis. Neurologic:  Alert and oriented x3;  grossly normal neurologically. Skin:  Intact without significant lesions or rashes. No jaundice. Psych:  Alert and  cooperative. Normal mood and affect.  Imaging Studies: Reviewed  Assessment and Plan:   Courtney [name] is a 66 y.o. present female with history of tobacco use is seen in consultation for abnormal CT revealing mild thickening and hyperenhancement of the left colon.  Patient's symptoms of abdominal pain and diarrhea have currently resolved.  Likely infectious etiology since symptoms were self-limited and spontaneously resolved.  Proceed with screening colonoscopy   Follow up as needed   Courtney Repress, MD

## 2022-07-28 ENCOUNTER — Encounter: Payer: Self-pay | Admitting: Gastroenterology

## 2022-07-28 DIAGNOSIS — G43909 Migraine, unspecified, not intractable, without status migrainosus: Secondary | ICD-10-CM | POA: Insufficient documentation

## 2022-07-28 DIAGNOSIS — J45909 Unspecified asthma, uncomplicated: Secondary | ICD-10-CM | POA: Insufficient documentation

## 2022-07-28 DIAGNOSIS — K219 Gastro-esophageal reflux disease without esophagitis: Secondary | ICD-10-CM | POA: Insufficient documentation

## 2022-08-25 ENCOUNTER — Encounter: Payer: Self-pay | Admitting: Gastroenterology

## 2022-08-31 ENCOUNTER — Ambulatory Visit: Payer: BC Managed Care – PPO | Admitting: Anesthesiology

## 2022-08-31 ENCOUNTER — Ambulatory Visit
Admission: RE | Admit: 2022-08-31 | Discharge: 2022-08-31 | Disposition: A | Discharge status: Home / Self Care | Payer: BC Managed Care – PPO | Attending: Gastroenterology | Admitting: Gastroenterology

## 2022-08-31 ENCOUNTER — Encounter: Payer: Self-pay | Admitting: Gastroenterology

## 2022-08-31 ENCOUNTER — Encounter
Admission: RE | Disposition: A | Discharge status: Home / Self Care | Payer: Self-pay | Source: Home / Self Care | Attending: Gastroenterology

## 2022-08-31 ENCOUNTER — Other Ambulatory Visit: Payer: Self-pay

## 2022-08-31 DIAGNOSIS — K635 Polyp of colon: Secondary | ICD-10-CM | POA: Diagnosis not present

## 2022-08-31 DIAGNOSIS — E139 Other specified diabetes mellitus without complications: Secondary | ICD-10-CM

## 2022-08-31 DIAGNOSIS — Z8673 Personal history of transient ischemic attack (TIA), and cerebral infarction without residual deficits: Secondary | ICD-10-CM | POA: Diagnosis not present

## 2022-08-31 DIAGNOSIS — Z86010 Personal history of colonic polyps: Secondary | ICD-10-CM

## 2022-08-31 DIAGNOSIS — Z1211 Encounter for screening for malignant neoplasm of colon: Secondary | ICD-10-CM | POA: Diagnosis present

## 2022-08-31 DIAGNOSIS — I10 Essential (primary) hypertension: Secondary | ICD-10-CM | POA: Insufficient documentation

## 2022-08-31 DIAGNOSIS — K621 Rectal polyp: Secondary | ICD-10-CM

## 2022-08-31 DIAGNOSIS — J449 Chronic obstructive pulmonary disease, unspecified: Secondary | ICD-10-CM | POA: Diagnosis not present

## 2022-08-31 DIAGNOSIS — F17210 Nicotine dependence, cigarettes, uncomplicated: Secondary | ICD-10-CM | POA: Diagnosis not present

## 2022-08-31 DIAGNOSIS — K5730 Diverticulosis of large intestine without perforation or abscess without bleeding: Secondary | ICD-10-CM | POA: Insufficient documentation

## 2022-08-31 HISTORY — PX: COLONOSCOPY WITH PROPOFOL: SHX5780

## 2022-08-31 HISTORY — DX: Unspecified osteoarthritis, unspecified site: M19.90

## 2022-08-31 HISTORY — PX: POLYPECTOMY: SHX5525

## 2022-08-31 SURGERY — COLONOSCOPY WITH PROPOFOL
Anesthesia: General | Site: Rectum

## 2022-08-31 MED ORDER — LACTATED RINGERS IV SOLN: INTRAVENOUS | Status: DC

## 2022-08-31 MED ORDER — PROPOFOL 10 MG/ML IV BOLUS
INTRAVENOUS | Status: DC | PRN
  Administered 2022-08-31: 25 mg via INTRAVENOUS
  Administered 2022-08-31 (×4): 50 mg via INTRAVENOUS
  Administered 2022-08-31 (×3): 25 mg via INTRAVENOUS

## 2022-08-31 MED ORDER — SODIUM CHLORIDE 0.9 % IV SOLN: INTRAVENOUS | Status: DC

## 2022-08-31 MED ORDER — STERILE WATER FOR IRRIGATION IR SOLN
Status: DC | PRN
  Administered 2022-08-31: .05 mL

## 2022-08-31 MED ORDER — LIDOCAINE HCL (CARDIAC) PF 100 MG/5ML IV SOSY
PREFILLED_SYRINGE | INTRAVENOUS | Status: DC | PRN
  Administered 2022-08-31: 60 mg via INTRAVENOUS

## 2022-08-31 MED ORDER — STERILE WATER FOR IRRIGATION IR SOLN
Status: DC | PRN
  Administered 2022-08-31: 100 mL

## 2022-08-31 SURGICAL SUPPLY — 9 items
FORCEPS BIOP RAD 4 LRG CAP 4 (CUTTING FORCEPS) IMPLANT
GOWN CVR UNV OPN BCK APRN NK (MISCELLANEOUS) ×4 IMPLANT
GOWN ISOL THUMB LOOP REG UNIV (MISCELLANEOUS) ×4
KIT PRC NS LF DISP ENDO (KITS) ×2 IMPLANT
KIT PROCEDURE OLYMPUS (KITS) ×2
MANIFOLD NEPTUNE II (INSTRUMENTS) ×2 IMPLANT
SNARE COLD EXACTO (MISCELLANEOUS) IMPLANT
TRAP ETRAP POLY (MISCELLANEOUS) IMPLANT
WATER STERILE IRR 250ML POUR (IV SOLUTION) ×2 IMPLANT

## 2022-08-31 NOTE — Anesthesia Postprocedure Evaluation (Signed)
Anesthesia Post Note  Patient: Courtney [name]  Procedure(s) Performed: COLONOSCOPY WITH PROPOFOL (Rectum) POLYPECTOMY (Rectum)     Patient location during evaluation: PACU Anesthesia Type: General Level of consciousness: awake and alert Pain management: pain level controlled Vital Signs Assessment: post-procedure vital signs reviewed and stable Respiratory status: spontaneous breathing, nonlabored ventilation, respiratory function stable and patient connected to nasal cannula oxygen Cardiovascular status: blood pressure returned to baseline and stable Postop Assessment: no apparent nausea or vomiting Anesthetic complications: no   There were no known notable events for this encounter.  Stephanie Coup

## 2022-08-31 NOTE — Anesthesia Preprocedure Evaluation (Signed)
Anesthesia Evaluation  Patient identified by MRN, date of birth, ID band Patient awake    Reviewed: Allergy & Precautions, NPO status , Patient's Chart, lab work & pertinent test results  History of Anesthesia Complications Negative for: history of anesthetic complications  Airway Mallampati: II  TM Distance: >3 FB Neck ROM: full    Dental  (+) Dental Advidsory Given, Teeth Intact   Pulmonary neg shortness of breath, COPD,  COPD inhaler, neg recent URI, Current Smoker and Patient abstained from smoking.,    Pulmonary exam normal        Cardiovascular hypertension, (-) angina(-) Past MI and (-) CABG negative cardio ROS Normal cardiovascular exam     Neuro/Psych PSYCHIATRIC DISORDERS TIA   GI/Hepatic Neg liver ROS, GERD  ,  Endo/Other  negative endocrine ROS  Renal/GU negative Renal ROS  negative genitourinary   Musculoskeletal   Abdominal   Peds  Hematology negative hematology ROS (+)   Anesthesia Other Findings Past Medical History: No date: Arthritis No date: Asthma No date: COPD (chronic obstructive pulmonary disease) (HCC) No date: Hypertension 2016: TIA (transient ischemic attack)     Comment:  No deficits  Past Surgical History: 1986: CESAREAN SECTION  BMI    Body Mass Index: 18.95 kg/m      Reproductive/Obstetrics negative OB ROS                             Anesthesia Physical Anesthesia Plan  ASA: 2  Anesthesia Plan: General   Post-op Pain Management: Minimal or no pain anticipated   Induction: Intravenous  PONV Risk Score and Plan: 3 and Propofol infusion, TIVA and Ondansetron  Airway Management Planned: Nasal Cannula  Additional Equipment: None  Intra-op Plan:   Post-operative Plan:   Informed Consent: I have reviewed the patients History and Physical, chart, labs and discussed the procedure including the risks, benefits and alternatives for the proposed  anesthesia with the patient or authorized representative who has indicated his/her understanding and acceptance.     Dental advisory given  Plan Discussed with: CRNA and Surgeon  Anesthesia Plan Comments: (Discussed risks of anesthesia with patient, including possibility of difficulty with spontaneous ventilation under anesthesia necessitating airway intervention, PONV, and rare risks such as cardiac or respiratory or neurological events, and allergic reactions. Discussed the role of CRNA in patient's perioperative care. Patient understands.)        Anesthesia Quick Evaluation

## 2022-08-31 NOTE — Op Note (Signed)
[facility] Gastroenterology Patient Name: Courtney LollingSherrie [name] Procedure Date: 08/31/2022 7:15 AM MRN: [id number] Account #: [id number] Date of Birth: 12/12/1956 Admit Type: Outpatient Age: 65 Room: [facility]OM 01 Gender: Female Note Status: Finalized Instrument Name: Peds [id number] Procedure:             Colonoscopy Indications:           Screening for colorectal malignant neoplasm Providers:             Toney Reilohini Reddy Kynsli Haapala MD, MD Referring MD:          Nat ChristenMario E. Zada Finderslmedo, MD (Referring MD) Medicines:             General Anesthesia Complications:         No immediate complications. Estimated blood loss: None. Procedure:             Pre-Anesthesia Assessment:                        - Prior to the procedure, a History and Physical was                         performed, and patient medications and allergies were                         reviewed. The patient is competent. The risks and                         benefits of the procedure and the sedation options and                         risks were discussed with the patient. All questions                         were answered and informed consent was obtained.                         Patient identification and proposed procedure were                         verified by the physician, the nurse, the                         anesthesiologist, the anesthetist and the technician                         in the pre-procedure area in the procedure room in the                         endoscopy suite. Mental Status Examination: alert and                         oriented. Airway Examination: normal oropharyngeal                         airway and neck mobility. Respiratory Examination:                         clear to auscultation. CV Examination: normal.  Prophylactic Antibiotics: The patient does not require                         prophylactic antibiotics. Prior Anticoagulants: The                          patient has taken no previous anticoagulant or                         antiplatelet agents. ASA Grade Assessment: II - A                         patient with mild systemic disease. After reviewing                         the risks and benefits, the patient was deemed in                         satisfactory condition to undergo the procedure. The                         anesthesia plan was to use general anesthesia.                         Immediately prior to administration of medications,                         the patient was re-assessed for adequacy to receive                         sedatives. The heart rate, respiratory rate, oxygen                         saturations, blood pressure, adequacy of pulmonary                         ventilation, and response to care were monitored                         throughout the procedure. The physical status of the                         patient was re-assessed after the procedure.                        After obtaining informed consent, the colonoscope was                         passed under direct vision. Throughout the procedure,                         the patient's blood pressure, pulse, and oxygen                         saturations were monitored continuously. The was                         introduced through the anus and advanced to the the  cecum, identified by appendiceal orifice and ileocecal                         valve. The colonoscopy was performed without                         difficulty. The patient tolerated the procedure well.                         The quality of the bowel preparation was evaluated                         using the BBPS [facility] Bowel Preparation Scale) with                         scores of: Right Colon = 3, Transverse Colon = 3 and                         Left Colon = 3 (entire mucosa seen well with no                         residual staining, small fragments of stool or opaque                          liquid). The total BBPS score equals 9. Findings:      The perianal and digital rectal examinations were normal. Pertinent       negatives include normal sphincter tone and no palpable rectal lesions.      Two sessile polyps were found in the rectum and cecum. The polyps were       diminutive in size. These polyps were removed with a cold biopsy       forceps. Resection and retrieval were complete.      Two sessile polyps were found in the rectum. The polyps were 3 to 4 mm       in size. These polyps were removed with a cold snare. Resection and       retrieval were complete. Estimated blood loss: none.      The retroflexed view of the distal rectum and anal verge was normal and       showed no anal or rectal abnormalities.      A few diverticula were found in the ascending colon and cecum. Impression:            - Two diminutive polyps in the rectum and in the                         cecum, removed with a cold biopsy forceps. Resected                         and retrieved.                        - Two 3 to 4 mm polyps in the rectum, removed with a                         cold snare. Resected and retrieved.                        -  The distal rectum and anal verge are normal on                         retroflexion view.                        - Diverticulosis in the ascending colon and in the                         cecum. Recommendation:        - Discharge patient to home (with escort).                        - Resume previous diet today.                        - Continue present medications.                        - Await pathology results.                        - Repeat colonoscopy in 5-10 years for surveillance of                         multiple polyps. Procedure Code(s):     --- Professional ---                        [phone], Colonoscopy, flexible; with removal of                         tumor(s), polyp(s), or other lesion(s) by snare                          technique                        45380, 59, Colonoscopy, flexible; with biopsy, single                         or multiple Diagnosis Code(s):     --- Professional ---                        Z12.11, Encounter for screening for malignant neoplasm                         of colon                        K57.30, Diverticulosis of large intestine without                         perforation or abscess without bleeding                        K62.1, Rectal polyp                        K63.5, Polyp of colon CPT copyright 2019 American Medical Association. All rights reserved. The codes documented in this report are preliminary and upon coder review may  be revised to  meet current compliance requirements. Dr. Libby Maw Toney Reil MD, MD 08/31/2022 8:02:44 AM This report has been signed electronically. Number of Addenda: 0 Note Initiated On: 08/31/2022 7:15 AM Scope Withdrawal Time: 0 hours 13 minutes 48 seconds  Total Procedure Duration: 0 hours 17 minutes 6 seconds  Estimated Blood Loss:  Estimated blood loss: none.      [facility]

## 2022-08-31 NOTE — Transfer of Care (Signed)
Immediate Anesthesia Transfer of Care Note  Patient: Courtney [name]  Procedure(s) Performed: COLONOSCOPY WITH PROPOFOL (Rectum) POLYPECTOMY (Rectum)  Patient Location: PACU  Anesthesia Type: General  Level of Consciousness: awake, alert  and patient cooperative  Airway and Oxygen Therapy: Patient Spontanous Breathing and Patient connected to supplemental oxygen  Post-op Assessment: Post-op Vital signs reviewed, Patient's Cardiovascular Status Stable, Respiratory Function Stable, Patent Airway and No signs of Nausea or vomiting  Post-op Vital Signs: Reviewed and stable  Complications: There were no known notable events for this encounter.

## 2022-08-31 NOTE — H&P (Signed)
Arlyss Repress, MD [address]  [address]  [place], [place] [address]  Main: [phone]  Fax: [phone] Pager: [phone]  Primary Care Physician:  [facility] Primary Gastroenterologist:  Dr. Arlyss Repress  Pre-Procedure History & Physical: HPI:  Courtney [name] is a 66 y.o. female is here for an colonoscopy.   Past Medical History:  Diagnosis Date   Arthritis    Asthma    COPD (chronic obstructive pulmonary disease) (HCC)    Hypertension    TIA (transient ischemic attack) 2016   No deficits    Past Surgical History:  Procedure Laterality Date   CESAREAN SECTION  1986    Prior to Admission medications   Medication Sig Start Date End Date Taking? Authorizing Provider  albuterol (PROVENTIL HFA;VENTOLIN HFA) 108 (90 Base) MCG/ACT inhaler Inhale 1-2 puffs into the lungs every 6 (six) hours as needed for wheezing or shortness of breath. 01/31/19  Yes Cook, Jayce G, DO  aspirin 81 MG tablet Take 81 mg by mouth daily.   Yes [provider]  hydrochlorothiazide (HYDRODIURIL) 25 MG tablet Take 25 mg by mouth daily.   Yes [provider]  Multiple Vitamin (MULTI-VITAMIN) tablet Take 1 tablet by mouth daily.   Yes [provider]  Olopatadine HCl 0.2 % SOLN Apply 1 drop to eye daily. 03/16/18  Yes Everlene Other G, DO    Allergies as of 07/27/2022 - Review Complete 07/27/2022  Allergen Reaction Noted   Beclomethasone Cough 07/05/2016    Family History  Problem Relation Age of Onset   Diabetes Mother    Cancer Father    Breast cancer Neg Hx     Social History   Socioeconomic History   Marital status: Married    Spouse name: Not on file   Number of children: Not on file   Years of education: Not on file   Highest education level: Not on file  Occupational History   Not on file  Tobacco Use   Smoking status: Every Day    Packs/day: 1.00    Years: 40.00    Total pack years: 40.00    Types: Cigarettes   Smokeless  tobacco: Never  Vaping Use   Vaping Use: Never used  Substance and Sexual Activity   Alcohol use: Yes    Alcohol/week: 14.0 standard drinks of alcohol    Types: 14 Cans of beer per week   Drug use: Never   Sexual activity: Not on file  Other Topics Concern   Not on file  Social History Narrative   Not on file   Social Determinants of Health   Financial Resource Strain: Not on file  Food Insecurity: Not on file  Transportation Needs: Not on file  Physical Activity: Not on file  Stress: Not on file  Social Connections: Not on file  Intimate Partner Violence: Not on file    Review of Systems: See HPI, otherwise negative ROS  Physical Exam: BP 128/87   Pulse 70   Temp (!) 97.5 F (36.4 C) (Temporal)   Resp 13   Ht 5\' 3"  (1.6 m)   Wt 48.5 kg   SpO2 96%   BMI 18.95 kg/m  General:   Alert,  pleasant and cooperative in NAD Head:  Normocephalic and atraumatic. Neck:  Supple; no masses or thyromegaly. Lungs:  Clear throughout to auscultation.    Heart:  Regular rate and rhythm. Abdomen:  Soft, nontender and nondistended. Normal bowel sounds, without guarding, and  without rebound.   Neurologic:  Alert and  oriented x4;  grossly normal neurologically.  Impression/Plan: Courtney [name] is here for an colonoscopy to be performed for colon cancer screening  Risks, benefits, limitations, and alternatives regarding  colonoscopy have been reviewed with the patient.  Questions have been answered.  All parties agreeable.   Lannette Donath, MD  08/31/2022, 7:26 AM

## 2022-09-01 ENCOUNTER — Encounter: Payer: Self-pay | Admitting: Gastroenterology

## 2022-09-02 LAB — SURGICAL PATHOLOGY

## 2022-09-03 ENCOUNTER — Encounter: Payer: Self-pay | Admitting: Gastroenterology

## 2022-12-22 ENCOUNTER — Other Ambulatory Visit: Payer: Self-pay | Admitting: Family Medicine

## 2022-12-22 DIAGNOSIS — Z1231 Encounter for screening mammogram for malignant neoplasm of breast: Secondary | ICD-10-CM

## 2022-12-28 ENCOUNTER — Ambulatory Visit: Payer: BC Managed Care – PPO

## 2022-12-30 ENCOUNTER — Ambulatory Visit
Admission: RE | Admit: 2022-12-30 | Discharge: 2022-12-30 | Disposition: A | Discharge status: Home / Self Care | Payer: Medicare HMO | Source: Ambulatory Visit | Attending: Family Medicine | Admitting: Family Medicine

## 2022-12-30 DIAGNOSIS — Z1231 Encounter for screening mammogram for malignant neoplasm of breast: Secondary | ICD-10-CM | POA: Insufficient documentation

## 2023-01-05 ENCOUNTER — Other Ambulatory Visit (INDEPENDENT_AMBULATORY_CARE_PROVIDER_SITE_OTHER): Payer: Self-pay | Admitting: Nurse Practitioner

## 2023-01-11 ENCOUNTER — Other Ambulatory Visit (INDEPENDENT_AMBULATORY_CARE_PROVIDER_SITE_OTHER): Payer: Self-pay | Admitting: Nurse Practitioner

## 2023-01-11 DIAGNOSIS — R9389 Abnormal findings on diagnostic imaging of other specified body structures: Secondary | ICD-10-CM

## 2023-01-11 DIAGNOSIS — R0989 Other specified symptoms and signs involving the circulatory and respiratory systems: Secondary | ICD-10-CM

## 2023-01-13 ENCOUNTER — Encounter (INDEPENDENT_AMBULATORY_CARE_PROVIDER_SITE_OTHER): Payer: Self-pay | Admitting: Nurse Practitioner

## 2023-01-13 ENCOUNTER — Ambulatory Visit (INDEPENDENT_AMBULATORY_CARE_PROVIDER_SITE_OTHER): Payer: Medicare HMO | Admitting: Nurse Practitioner

## 2023-01-13 ENCOUNTER — Ambulatory Visit (INDEPENDENT_AMBULATORY_CARE_PROVIDER_SITE_OTHER): Payer: Medicare HMO

## 2023-01-13 VITALS — BP 117/79 | HR 77 | Ht 63.0 in | Wt 107.0 lb

## 2023-01-13 DIAGNOSIS — R0989 Other specified symptoms and signs involving the circulatory and respiratory systems: Secondary | ICD-10-CM | POA: Diagnosis not present

## 2023-01-13 DIAGNOSIS — I10 Essential (primary) hypertension: Secondary | ICD-10-CM | POA: Diagnosis not present

## 2023-01-13 DIAGNOSIS — R9389 Abnormal findings on diagnostic imaging of other specified body structures: Secondary | ICD-10-CM

## 2023-01-24 ENCOUNTER — Encounter (INDEPENDENT_AMBULATORY_CARE_PROVIDER_SITE_OTHER): Payer: Self-pay | Admitting: Nurse Practitioner

## 2023-01-24 NOTE — Progress Notes (Signed)
Subjective:    Patient ID: Courtney [name], female    DOB: 09/16/1956, 67 y.o.   MRN: [id number] Chief Complaint  Patient presents with   New Patient (Initial Visit)    ABI    The patient is a 67 year old female who presents today for evaluation for decreased pedal pulses and diminished ABIs done by home health.  She notes she was evaluated by her insurance company.  She notes that sometimes her foot stays little colder and her toes feel numb sometimes.  However in the same foot she fell from horse approximately 37 years ago and had a huge hematoma.  The patient has a history of smoking.  However she denies any claudication-like symptoms.  She denies any open wounds or ulcerations.  She denies any rest pain like symptoms.  Today noninvasive studies show an ABI of 1.22 on the right and 1.07 on the left.  She has a TBI 0.78 on the right and 0.95 on the left.  She has strong triphasic tibial artery waveforms bilaterally with good toe waveforms bilaterally.    Review of Systems  Cardiovascular:  Negative for leg swelling.  All other systems reviewed and are negative.      Objective:   Physical Exam Vitals reviewed.  HENT:     Head: Normocephalic.  Cardiovascular:     Rate and Rhythm: Normal rate.     Pulses:          Dorsalis pedis pulses are 1+ on the right side and 1+ on the left side.       Posterior tibial pulses are detected w/ Doppler on the right side and detected w/ Doppler on the left side.  Pulmonary:     Effort: Pulmonary effort is normal.  Skin:    General: Skin is warm and dry.  Neurological:     Mental Status: She is alert and oriented to person, place, and time.  Psychiatric:        Mood and Affect: Mood normal.        Behavior: Behavior normal.        Thought Content: Thought content normal.        Judgment: Judgment normal.     BP 117/79   Pulse 77   Ht 5\' 3"  (1.6 m)   Wt 107 lb (48.5 kg)   BMI 18.95 kg/m   Past Medical History:  Diagnosis Date    Arthritis    Asthma    COPD (chronic obstructive pulmonary disease) (HCC)    Hypertension    TIA (transient ischemic attack) 2016   No deficits    Social History   Socioeconomic History   Marital status: Married    Spouse name: Not on file   Number of children: Not on file   Years of education: Not on file   Highest education level: Not on file  Occupational History   Not on file  Tobacco Use   Smoking status: Every Day    Packs/day: 1.00    Years: 40.00    Total pack years: 40.00    Types: Cigarettes   Smokeless tobacco: Never  Vaping Use   Vaping Use: Never used  Substance and Sexual Activity   Alcohol use: Yes    Alcohol/week: 14.0 standard drinks of alcohol    Types: 14 Cans of beer per week   Drug use: Never   Sexual activity: Not on file  Other Topics Concern   Not on file  Social History Narrative  Not on file   Social Determinants of Health   Financial Resource Strain: Not on file  Food Insecurity: Not on file  Transportation Needs: Not on file  Physical Activity: Not on file  Stress: Not on file  Social Connections: Not on file  Intimate Partner Violence: Not on file    Past Surgical History:  Procedure Laterality Date   CESAREAN SECTION  1986   COLONOSCOPY WITH PROPOFOL N/A 08/31/2022   Procedure: COLONOSCOPY WITH PROPOFOL;  Surgeon: Lin Landsman, MD;  Location: [facility] [place];  Service: Endoscopy;  Laterality: N/A;   POLYPECTOMY  08/31/2022   Procedure: POLYPECTOMY;  Surgeon: Lin Landsman, MD;  Location: [facility];  Service: Endoscopy;;    Family History  Problem Relation Age of Onset   Diabetes Mother    Cancer Father    Breast cancer Neg Hx     Allergies  Allergen Reactions   Beclomethasone Cough       Latest Ref Rng & Units 06/01/2022    7:15 AM 07/22/2020    1:51 PM 10/26/2014    4:07 AM  CBC  WBC 4.0 - 10.5 K/uL 6.7  5.9  6.3   Hemoglobin 12.0 - 15.0 g/dL 14.7  15.4  14.8   Hematocrit 36.0 - 46.0 %  42.7  43.4  43.5   Platelets 150 - 400 K/uL 274  282  265       CMP     Component Value Date/Time   NA 136 06/01/2022 0715   NA 143 10/26/2014 0407   K 3.5 06/01/2022 0715   K 4.0 10/26/2014 0407   CL 103 06/01/2022 0715   CL 110 (H) 10/26/2014 0407   CO2 26 06/01/2022 0715   CO2 29 10/26/2014 0407   GLUCOSE 114 (H) 06/01/2022 0715   GLUCOSE 100 (H) 10/26/2014 0407   BUN 11 06/01/2022 0715   BUN 12 10/26/2014 0407   CREATININE 0.46 06/01/2022 0715   CREATININE 0.68 10/26/2014 0407   CALCIUM 9.3 06/01/2022 0715   CALCIUM 8.2 (L) 10/26/2014 0407   PROT 6.9 06/01/2022 0715   ALBUMIN 4.2 06/01/2022 0715   AST 31 06/01/2022 0715   ALT 25 06/01/2022 0715   ALKPHOS 70 06/01/2022 0715   BILITOT 0.7 06/01/2022 0715   GFRNONAA >60 06/01/2022 0715   GFRNONAA >60 10/26/2014 0407   GFRAA >60 07/22/2020 1351   GFRAA >60 10/26/2014 0407     VAS [place] ABI WITH/WO TBI  Result Date: 01/17/2023  LOWER EXTREMITY DOPPLER STUDY Patient Name:  Courtney [name]  Date of Exam:   01/13/2023 Medical Rec #: [id number]         Accession #:    [id number] Date of Birth: [birth year]        Patient Gender: F Patient Age:   68 years Exam Location:  [facility] Procedure:      VAS [place] ABI WITH/WO TBI Referring Phys: --------------------------------------------------------------------------------  Indications: abnormal home health screen  Performing Technologist: Concha Norway RVT  Examination Guidelines: A complete evaluation includes at minimum, Doppler waveform signals and systolic blood pressure reading at the level of bilateral brachial, anterior tibial, and posterior tibial arteries, when vessel segments are accessible. Bilateral testing is considered an integral part of a complete examination. Photoelectric Plethysmograph (PPG) waveforms and toe systolic pressure readings are included as required and additional duplex testing as needed. Limited examinations for reoccurring indications may be  performed as noted.  ABI Findings: +---------+------------------+-----+---------+--------+ Right    Rt  Pressure (mmHg)IndexWaveform Comment  +---------+------------------+-----+---------+--------+ Brachial 130                                      +---------+------------------+-----+---------+--------+ ATA      145               1.12 triphasic         +---------+------------------+-----+---------+--------+ PTA      158               1.22 triphasic         +---------+------------------+-----+---------+--------+ Great Toe102               0.78 Abnormal          +---------+------------------+-----+---------+--------+ +---------+------------------+-----+---------+-------+ Left     Lt Pressure (mmHg)IndexWaveform Comment +---------+------------------+-----+---------+-------+ Brachial 130                                     +---------+------------------+-----+---------+-------+ ATA      139               1.07 triphasic        +---------+------------------+-----+---------+-------+ PTA      138               1.06 triphasic        +---------+------------------+-----+---------+-------+ Great Toe124               0.95 Normal           +---------+------------------+-----+---------+-------+  Summary: Right: Resting right ankle-brachial index is within normal range. The right toe-brachial index is normal. Left: Resting left ankle-brachial index is within normal range. The left toe-brachial index is normal. *See table(s) above for measurements and observations.  Electronically signed by Hortencia Pilar MD on 01/17/2023 at 1:18:37 PM.    Final        Assessment & Plan:   1. Decreased pedal pulses Today the patient has a no evidence of any significant peripheral arterial disease.  I discussed with patient that the studies are normal today however she does have significant risk factors.  We discussed the typical for signs and symptoms being a change in discomfort with  walking usually described as claudication.  If the patient begins to develop any of the symptoms she is advised to follow-up with [place] for further evaluation.  Otherwise she will follow-up as needed - VAS [place] ABI WITH/WO TBI  2. Essential hypertension Continue antihypertensive medications as already ordered, these medications have been reviewed and there are no changes at this time.   Current Outpatient Medications on File Prior to Visit  Medication Sig Dispense Refill   albuterol (PROVENTIL HFA;VENTOLIN HFA) 108 (90 Base) MCG/ACT inhaler Inhale 1-2 puffs into the lungs every 6 (six) hours as needed for wheezing or shortness of breath. 1 Inhaler 0   aspirin 81 MG tablet Take 81 mg by mouth daily.     hydrochlorothiazide (HYDRODIURIL) 25 MG tablet Take 25 mg by mouth daily.     Multiple Vitamin (MULTI-VITAMIN) tablet Take 1 tablet by mouth daily.     Olopatadine HCl 0.2 % SOLN Apply 1 drop to eye daily. 2.5 mL 0   Oxymetazoline HCl (NASAL SPRAY NA) Place into the nose as needed.     No current facility-administered medications on file prior to visit.    There are no Patient Instructions on file  for this visit. No follow-ups on file.   Kris Hartmann, NP

## 2023-02-02 ENCOUNTER — Telehealth: Payer: Self-pay

## 2023-02-02 ENCOUNTER — Other Ambulatory Visit: Payer: Self-pay | Admitting: Family Medicine

## 2023-02-02 DIAGNOSIS — Z87891 Personal history of nicotine dependence: Secondary | ICD-10-CM

## 2023-02-02 DIAGNOSIS — Z122 Encounter for screening for malignant neoplasm of respiratory organs: Secondary | ICD-10-CM

## 2023-02-02 NOTE — Telephone Encounter (Signed)
error- pt contact office about ct scan but we never received order from [facility] . I asked patient if she was being referred to [facility] but she state she didnt believe she was. I provided the imaging # for [facility] with hopes that they can better  help patient. I advised her if we are the office that she needs just call us back  . nothing further needed

## 2023-02-10 ENCOUNTER — Ambulatory Visit: Admission: RE | Admit: 2023-02-10 | Payer: Medicare HMO | Source: Ambulatory Visit

## 2023-03-02 ENCOUNTER — Other Ambulatory Visit: Payer: Self-pay | Admitting: Obstetrics and Gynecology

## 2023-03-02 DIAGNOSIS — Z1231 Encounter for screening mammogram for malignant neoplasm of breast: Secondary | ICD-10-CM

## 2023-03-09 ENCOUNTER — Ambulatory Visit
Admission: RE | Admit: 2023-03-09 | Discharge: 2023-03-09 | Disposition: A | Discharge status: Home / Self Care | Payer: Medicare HMO | Source: Ambulatory Visit | Attending: Family Medicine | Admitting: Family Medicine

## 2023-03-09 DIAGNOSIS — Z87891 Personal history of nicotine dependence: Secondary | ICD-10-CM

## 2023-03-09 DIAGNOSIS — Z122 Encounter for screening for malignant neoplasm of respiratory organs: Secondary | ICD-10-CM

## 2023-06-13 ENCOUNTER — Other Ambulatory Visit: Payer: Self-pay | Admitting: Physician Assistant

## 2023-06-13 DIAGNOSIS — E278 Other specified disorders of adrenal gland: Secondary | ICD-10-CM

## 2023-06-22 ENCOUNTER — Ambulatory Visit: Admission: RE | Admit: 2023-06-22 | Payer: Medicare HMO | Source: Ambulatory Visit

## 2023-10-12 ENCOUNTER — Other Ambulatory Visit: Payer: Self-pay | Admitting: Otolaryngology

## 2023-10-12 DIAGNOSIS — R1310 Dysphagia, unspecified: Secondary | ICD-10-CM

## 2023-10-20 ENCOUNTER — Ambulatory Visit
Admission: RE | Admit: 2023-10-20 | Discharge: 2023-10-20 | Disposition: A | Discharge status: Home / Self Care | Payer: Medicare HMO | Source: Ambulatory Visit | Attending: Otolaryngology | Admitting: Otolaryngology

## 2023-10-20 DIAGNOSIS — R1310 Dysphagia, unspecified: Secondary | ICD-10-CM | POA: Diagnosis present

## 2024-01-02 ENCOUNTER — Ambulatory Visit: Payer: Medicare HMO

## 2024-01-04 ENCOUNTER — Ambulatory Visit
Admission: RE | Admit: 2024-01-04 | Discharge: 2024-01-04 | Disposition: A | Discharge status: Home / Self Care | Payer: Medicare HMO | Source: Ambulatory Visit | Attending: Obstetrics and Gynecology | Admitting: Obstetrics and Gynecology

## 2024-01-04 DIAGNOSIS — Z1231 Encounter for screening mammogram for malignant neoplasm of breast: Secondary | ICD-10-CM | POA: Diagnosis present

## 2024-02-17 ENCOUNTER — Telehealth: Payer: Self-pay

## 2024-02-17 DIAGNOSIS — Z87891 Personal history of nicotine dependence: Secondary | ICD-10-CM

## 2024-02-17 DIAGNOSIS — Z122 Encounter for screening for malignant neoplasm of respiratory organs: Secondary | ICD-10-CM

## 2024-02-17 DIAGNOSIS — F17210 Nicotine dependence, cigarettes, uncomplicated: Secondary | ICD-10-CM

## 2024-02-17 NOTE — Telephone Encounter (Signed)
.  Lung Cancer Screening Narrative/Criteria Questionnaire (Cigarette Smokers Only- No Cigars/Pipes/vapes)   Courtney [name]   SDMV:03/01/2024 at 9:15 am with Orpha Bur      [birth year]   LDCT: 03/02/2024 at 9:30 am at [facility]    68 y.o.   Phone: [phone]  Lung Screening Narrative (confirm age 56-77 yrs Medicare / 50-80 yrs Private pay insurance)   Insurance information: Community education officer   Referring Provider: Zada Finders   This screening involves an initial phone call with a team member from our program. It is called a shared decision making visit. The initial meeting is required by  insurance and Medicare to make sure you understand the program. This appointment takes about 15-20 minutes to complete. You will complete the screening scan at your scheduled date/time.  This scan takes about 5-10 minutes to complete. You can eat and drink normally before and after the scan.  Criteria questions for Lung Cancer Screening:   Are you a current or former smoker? Current Age began smoking: 15   If you are a former smoker, what year did you quit smoking? (within 15 yrs)   To calculate your smoking history, I need an accurate estimate of how many packs of cigarettes you smoked per day and for how many years. (Not just the number of PPD you are now smoking)   Years smoking 52 x Packs per day 2 = Pack years 104   (at least 20 pack yrs)   (Make sure they understand that we need to know how much they have smoked in the past, not just the number of PPD they are smoking now)  Do you have a personal history of cancer?  No    Do you have a family history of cancer? Yes  (cancer type and and relative) Mother Ovarian   Are you coughing up blood?  No  Have you had unexplained weight loss of 15 lbs or more in the last 6 months? No  It looks like you meet all criteria.  When would be a good time for [place] to schedule you for this screening?   Additional information: N/A

## 2024-03-01 ENCOUNTER — Encounter: Payer: Self-pay | Admitting: Adult Health

## 2024-03-01 ENCOUNTER — Ambulatory Visit: Payer: Medicare HMO | Admitting: Adult Health

## 2024-03-01 ENCOUNTER — Ambulatory Visit: Payer: Medicare HMO

## 2024-03-01 DIAGNOSIS — F17210 Nicotine dependence, cigarettes, uncomplicated: Secondary | ICD-10-CM | POA: Diagnosis not present

## 2024-03-01 NOTE — Patient Instructions (Signed)

## 2024-03-01 NOTE — Progress Notes (Signed)
  Virtual Visit via Telephone Note  I connected with Courtney [name] , 03/01/24 9:23 AM by a telemedicine application and verified that I am speaking with the correct person using two identifiers.  Location: Patient: home Provider: home   I discussed the limitations of evaluation and management by telemedicine and the availability of in person appointments. The patient expressed understanding and agreed to proceed.   Shared Decision Making Visit Lung Cancer Screening Program [phone])   Eligibility: 68 y.o. Pack Years Smoking History Calculation = 60 pack years  (# packs/per year x # years smoked) Recent History of coughing up blood  no Unexplained weight loss? no ( >Than 15 pounds within the last 6 months ) Prior History Lung / other cancer no (Diagnosis within the last 5 years already requiring surveillance chest CT Scans). Smoking Status Current Smoker  Visit Components: Discussion included one or more decision making aids. YES Discussion included risk/benefits of screening. YES Discussion included potential follow up diagnostic testing for abnormal scans. YES Discussion included meaning and risk of over diagnosis. YES Discussion included meaning and risk of False Positives. YES Discussion included meaning of total radiation exposure. YES  Counseling Included: Importance of adherence to annual lung cancer LDCT screening. YES Impact of comorbidities on ability to participate in the program. YES Ability and willingness to under diagnostic treatment. YES  Smoking Cessation Counseling: Current Smokers:  Discussed importance of smoking cessation. yes Information about tobacco cessation classes and interventions provided to patient. yes Patient provided with "ticket" for LDCT Scan. yes Symptomatic Patient. NO Diagnosis Code: Tobacco Use Z72.0 Asymptomatic Patient yes  Counseling (Intermediate counseling: > three minutes counseling) U0454 (CT Chest Lung Cancer Screening Low  Dose W/O CM) [id number]  Z12.2-Screening of respiratory organs Z87.891-Personal history of nicotine dependence   Danford Bad 03/01/24

## 2024-03-02 ENCOUNTER — Ambulatory Visit
Admission: RE | Admit: 2024-03-02 | Discharge: 2024-03-02 | Disposition: A | Discharge status: Home / Self Care | Payer: Medicare HMO | Source: Ambulatory Visit | Attending: Acute Care | Admitting: Acute Care

## 2024-03-02 DIAGNOSIS — Z87891 Personal history of nicotine dependence: Secondary | ICD-10-CM | POA: Diagnosis present

## 2024-03-02 DIAGNOSIS — Z122 Encounter for screening for malignant neoplasm of respiratory organs: Secondary | ICD-10-CM | POA: Insufficient documentation

## 2024-03-02 DIAGNOSIS — F17210 Nicotine dependence, cigarettes, uncomplicated: Secondary | ICD-10-CM | POA: Insufficient documentation

## 2024-04-10 ENCOUNTER — Other Ambulatory Visit: Payer: Self-pay

## 2024-04-10 DIAGNOSIS — F17210 Nicotine dependence, cigarettes, uncomplicated: Secondary | ICD-10-CM

## 2024-04-10 DIAGNOSIS — Z87891 Personal history of nicotine dependence: Secondary | ICD-10-CM

## 2024-04-10 DIAGNOSIS — Z122 Encounter for screening for malignant neoplasm of respiratory organs: Secondary | ICD-10-CM

## 2024-10-31 ENCOUNTER — Ambulatory Visit
Admission: RE | Admit: 2024-10-31 | Discharge: 2024-10-31 | Disposition: A | Discharge status: Home / Self Care | Payer: Self-pay

## 2024-10-31 VITALS — BP 158/79 | HR 77 | Temp 98.3°F | Resp 18 | Wt 99.0 lb

## 2024-10-31 DIAGNOSIS — R051 Acute cough: Secondary | ICD-10-CM

## 2024-10-31 DIAGNOSIS — J441 Chronic obstructive pulmonary disease with (acute) exacerbation: Secondary | ICD-10-CM | POA: Diagnosis not present

## 2024-10-31 DIAGNOSIS — R490 Dysphonia: Secondary | ICD-10-CM

## 2024-10-31 MED ORDER — DOXYCYCLINE HYCLATE 100 MG PO CAPS: 100.0000 mg | ORAL_CAPSULE | Freq: Two times a day (BID) | ORAL | 0 refills | Status: AC

## 2024-10-31 MED ORDER — PROMETHAZINE-DM 6.25-15 MG/5ML PO SYRP: 5.0000 mL | ORAL_SOLUTION | Freq: Four times a day (QID) | ORAL | 0 refills | Status: AC | PRN

## 2024-10-31 MED ORDER — PREDNISONE 20 MG PO TABS: 40.0000 mg | ORAL_TABLET | Freq: Every day | ORAL | 0 refills | Status: AC

## 2024-10-31 NOTE — Discharge Instructions (Signed)
-  COPD exacerbation.  Will treat at this time with doxycycline, prednisone , Promethazine DM.  Encouraged continuing use of inhalers at home, increasing rest and fluids.  Advised going to ED if fever or acute worsening or breathing.  Otherwise, advised to follow-up with PCP if symptoms are ongoing after completing antibiotics.

## 2024-10-31 NOTE — ED Triage Notes (Signed)
 Pt presents with a productive cough and sore throat x 2 weeks. Pt has a history of COPD. Pt has tried cough drops for her symptoms.

## 2024-10-31 NOTE — ED Provider Notes (Signed)
 [facility]    CSN: [id number] Arrival date & time: 10/31/24  0801      History   Chief Complaint Chief Complaint  Patient presents with   Cough    Cough and sore throat.  I have COPD. This has been going on for 2 weeks. Not getting batter - Entered by patient   Sore Throat    HPI Courtney [name] is a 68 y.o. female with history of asthma, COPD, hypertension, GERD, hyperlipidemia, history of TIAs and tobacco abuse.  Patient presents today for 2-week history of cough that is productive of mostly clear sputum, congestion and sore throat/scratchy throat.  Symptoms seemed to begin better last week but now she feels worse. Saw PCP and had negative strep test last week.  No associated fever, sinus pain, chest pain.  Reports increased wheezing or shortness of breath from baseline.  Has been using inhalers and taking cough drops.  No associated fever.  No other complaints.  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   Situational depression 05/26/2017   Pre-diabetes 12/31/2015   Essential hypertension 11/19/2014   History of TIAs 11/01/2014   Tobacco abuse 11/01/2014    Past Surgical History:  Procedure Laterality Date   CESAREAN SECTION  1986   COLONOSCOPY WITH PROPOFOL  N/A 08/31/2022   Procedure: COLONOSCOPY WITH PROPOFOL ;  Surgeon: Unk Corinn Skiff, MD;  Location: [facility];  Service: Endoscopy;  Laterality: N/A;    POLYPECTOMY  08/31/2022   Procedure: POLYPECTOMY;  Surgeon: Unk Corinn Skiff, MD;  Location: [facility];  Service: Endoscopy;;    OB History   No obstetric history on file.      Home Medications    Prior to Admission medications   Medication Sig Start Date End Date Taking? Authorizing Provider  albuterol  (PROVENTIL  HFA;VENTOLIN  HFA) 108 (90 Base) MCG/ACT inhaler Inhale 1-2 puffs into the lungs every 6 (six) hours as needed for wheezing or shortness of breath. 01/31/19  Yes Cook, Jayce G, DO  aspirin 81 MG tablet Take 81 mg by mouth daily.   Yes [provider]  citalopram (CELEXA) 20 MG tablet Take 20 mg by mouth. 06/14/24 06/14/25 Yes [provider]  doxycycline (VIBRAMYCIN) 100 MG capsule Take 1 capsule (100 mg total) by mouth 2 (two) times daily for 7 days. 10/31/24 11/07/24 Yes Arvis Jolan NOVAK, PA-C  hydrochlorothiazide (HYDRODIURIL) 25 MG tablet Take 25 mg by mouth daily.   Yes [provider]  predniSONE  (DELTASONE ) 20 MG tablet Take 2 tablets (40 mg total) by mouth daily for 5 days. 10/31/24 11/05/24 Yes Arvis Jolan NOVAK, PA-C  promethazine-dextromethorphan (PROMETHAZINE-DM) 6.25-15 MG/5ML syrup Take 5 mLs by mouth 4 (four) times daily as needed. 10/31/24  Yes Arvis Jolan NOVAK, PA-C  propranolol (INDERAL) 10 MG tablet Take 10 mg by mouth. 09/13/23  Yes [provider]  Multiple Vitamin (MULTI-VITAMIN) tablet  Take 1 tablet by mouth daily.    [provider]  Olopatadine  HCl 0.2 % SOLN Apply 1 drop to eye daily. 03/16/18   Cook, Jayce G, DO  Oxymetazoline HCl (NASAL SPRAY NA) Place into the nose as needed.    [provider]    Family History Family History  Problem Relation Age of Onset   Diabetes Mother    Cancer Father    Breast cancer Neg Hx     Social History Social History   Tobacco Use   Smoking status: Every Day    Current packs/day: 1.00    Average packs/day: 1 pack/day for 40.0 years (40.0 ttl pk-yrs)     Types: Cigarettes   Smokeless tobacco: Never  Vaping Use   Vaping status: Never Used  Substance Use Topics   Alcohol use: Yes    Alcohol/week: 14.0 standard drinks of alcohol    Types: 14 Cans of beer per week   Drug use: Never     Allergies   Beclomethasone   Review of Systems Review of Systems  Constitutional:  Positive for fatigue. Negative for chills, diaphoresis and fever.  HENT:  Positive for congestion and sore throat. Negative for ear pain, rhinorrhea, sinus pressure and sinus pain.   Respiratory:  Positive for cough, shortness of breath and wheezing.   Cardiovascular:  Negative for chest pain.  Gastrointestinal:  Negative for abdominal pain, nausea and vomiting.  Musculoskeletal:  Negative for arthralgias and myalgias.  Skin:  Negative for rash.  Neurological:  Negative for weakness and headaches.  Hematological:  Negative for adenopathy.     Physical Exam Triage Vital Signs ED Triage Vitals  Encounter Vitals Group     BP      Girls Systolic BP Percentile      Girls Diastolic BP Percentile      Boys Systolic BP Percentile      Boys Diastolic BP Percentile      Pulse      Resp      Temp      Temp src      SpO2      Weight      Height      Head Circumference      Peak Flow      Pain Score      Pain Loc      Pain Education      Exclude from Growth Chart    No data found.  Updated Vital Signs BP (!) 158/79 (BP Location: Right Arm)   Pulse 77   Temp 98.3 F (36.8 C) (Oral)   Resp 18   Wt 99 lb (44.9 kg)   SpO2 95%   BMI 17.54 kg/m     Physical Exam Vitals and nursing note reviewed.  Constitutional:      General: She is not in acute distress.    Appearance: Normal appearance. She is ill-appearing. She is not toxic-appearing.     Comments: +voice hoarseness  HENT:     Head: Normocephalic and atraumatic.     Nose: Congestion present.     Mouth/Throat:     Mouth: Mucous membranes are moist.     Pharynx: Oropharynx is clear.  Eyes:      General: No scleral icterus.       Right eye: No discharge.        Left eye: No discharge.     Conjunctiva/sclera: Conjunctivae normal.  Cardiovascular:     Rate and Rhythm: Normal rate  and regular rhythm.     Heart sounds: Normal heart sounds.  Pulmonary:     Effort: Pulmonary effort is normal. No respiratory distress.     Breath sounds: Wheezing present. No rhonchi.  Musculoskeletal:     Cervical back: Neck supple.  Skin:    General: Skin is dry.  Neurological:     General: No focal deficit present.     Mental Status: She is alert. Mental status is at baseline.     Motor: No weakness.     Gait: Gait normal.  Psychiatric:        Mood and Affect: Mood normal.        Behavior: Behavior normal.      UC Treatments / Results  Labs (all labs ordered are listed, but only abnormal results are displayed) Labs Reviewed - No data to display  EKG   Radiology No results found.  Procedures Procedures (including critical care time)  Medications Ordered in UC Medications - No data to display  Initial Impression / Assessment and Plan / UC Course  I have reviewed the triage vital signs and the nursing notes.  Pertinent labs & imaging results that were available during my care of the patient were reviewed by me and considered in my medical decision making (see chart for details).   68 year old female with history of asthma, COPD, TIAs, hypertension, hyperlipidemia, prediabetes and GERD presents for 2-week history of productive cough, congestion and sore throat.  Increased shortness of breath from baseline.  No associated fever.  Vitals are stable and normal and she is overall well-appearing.  No acute distress.  On exam has nasal congestion.  Throat clear.  Scattered wheezes.  COPD exacerbation.  Will treat at this time with doxycycline, prednisone , Promethazine DM.  Encouraged continuing use of inhalers at home, increasing rest and fluids.  Advised going to ED if fever or acute  worsening or breathing.  Otherwise, advised to follow-up with PCP if symptoms are ongoing after completing antibiotics.   Final Clinical Impressions(s) / UC Diagnoses   Final diagnoses:  COPD exacerbation (HCC)  Acute cough  Voice hoarseness     Discharge Instructions      -COPD exacerbation.  Will treat at this time with doxycycline, prednisone , Promethazine DM.  Encouraged continuing use of inhalers at home, increasing rest and fluids.  Advised going to ED if fever or acute worsening or breathing.  Otherwise, advised to follow-up with PCP if symptoms are ongoing after completing antibiotics.      ED Prescriptions     Medication Sig Dispense Auth. Provider   predniSONE  (DELTASONE ) 20 MG tablet Take 2 tablets (40 mg total) by mouth daily for 5 days. 10 tablet Arvis Jolan NOVAK, PA-C   promethazine-dextromethorphan (PROMETHAZINE-DM) 6.25-15 MG/5ML syrup Take 5 mLs by mouth 4 (four) times daily as needed. 118 mL Arvis Jolan B, PA-C   doxycycline (VIBRAMYCIN) 100 MG capsule Take 1 capsule (100 mg total) by mouth 2 (two) times daily for 7 days. 14 capsule Arvis Jolan NOVAK, PA-C      PDMP not reviewed this encounter.   Arvis Jolan NOVAK, PA-C 10/31/24 [phone]

## 2024-11-19 ENCOUNTER — Other Ambulatory Visit: Payer: Self-pay | Admitting: Physician Assistant

## 2024-11-19 DIAGNOSIS — Z780 Asymptomatic menopausal state: Secondary | ICD-10-CM

## 2024-11-19 DIAGNOSIS — Z1231 Encounter for screening mammogram for malignant neoplasm of breast: Secondary | ICD-10-CM

## 2025-01-08 ENCOUNTER — Ambulatory Visit
Admission: RE | Admit: 2025-01-08 | Discharge: 2025-01-08 | Disposition: A | Discharge status: Home / Self Care | Source: Ambulatory Visit | Attending: Physician Assistant | Admitting: Physician Assistant

## 2025-01-08 ENCOUNTER — Ambulatory Visit
Admission: RE | Admit: 2025-01-08 | Discharge: 2025-01-08 | Disposition: A | Source: Ambulatory Visit | Attending: Physician Assistant | Admitting: Physician Assistant

## 2025-01-08 DIAGNOSIS — Z1231 Encounter for screening mammogram for malignant neoplasm of breast: Secondary | ICD-10-CM | POA: Diagnosis present

## 2025-01-08 DIAGNOSIS — Z780 Asymptomatic menopausal state: Secondary | ICD-10-CM | POA: Diagnosis present

## 2025-03-05 ENCOUNTER — Ambulatory Visit
